# Patient Record
Sex: Female | Born: 1960 | Race: White | Hispanic: No | Marital: Married | Smoking: Former smoker
Health system: Southern US, Community
[De-identification: ages and names within clinical notes are randomized; demographics above are authoritative.]

## PROBLEM LIST (undated history)

## (undated) DIAGNOSIS — K219 Gastro-esophageal reflux disease without esophagitis: Secondary | ICD-10-CM

## (undated) DIAGNOSIS — E039 Hypothyroidism, unspecified: Secondary | ICD-10-CM

## (undated) DIAGNOSIS — I10 Essential (primary) hypertension: Secondary | ICD-10-CM

## (undated) DIAGNOSIS — G43909 Migraine, unspecified, not intractable, without status migrainosus: Secondary | ICD-10-CM

## (undated) DIAGNOSIS — F329 Major depressive disorder, single episode, unspecified: Secondary | ICD-10-CM

## (undated) DIAGNOSIS — J45909 Unspecified asthma, uncomplicated: Secondary | ICD-10-CM

## (undated) DIAGNOSIS — F32A Depression, unspecified: Secondary | ICD-10-CM

## (undated) DIAGNOSIS — D649 Anemia, unspecified: Secondary | ICD-10-CM

## (undated) DIAGNOSIS — E785 Hyperlipidemia, unspecified: Secondary | ICD-10-CM

## (undated) DIAGNOSIS — E559 Vitamin D deficiency, unspecified: Secondary | ICD-10-CM

## (undated) DIAGNOSIS — C73 Malignant neoplasm of thyroid gland: Secondary | ICD-10-CM

## (undated) HISTORY — DX: Essential (primary) hypertension: I10

## (undated) HISTORY — DX: Major depressive disorder, single episode, unspecified: F32.9

## (undated) HISTORY — DX: Unspecified asthma, uncomplicated: J45.909

## (undated) HISTORY — PX: APPENDECTOMY: SHX54

## (undated) HISTORY — DX: Anemia, unspecified: D64.9

## (undated) HISTORY — DX: Migraine, unspecified, not intractable, without status migrainosus: G43.909

## (undated) HISTORY — PX: MANDIBLE SURGERY: SHX707

## (undated) HISTORY — DX: Gastro-esophageal reflux disease without esophagitis: K21.9

## (undated) HISTORY — DX: Hyperlipidemia, unspecified: E78.5

## (undated) HISTORY — PX: POLYPECTOMY: SHX149

## (undated) HISTORY — DX: Vitamin D deficiency, unspecified: E55.9

## (undated) HISTORY — DX: Hypothyroidism, unspecified: E03.9

## (undated) HISTORY — DX: Depression, unspecified: F32.A

---

## 1998-07-13 ENCOUNTER — Ambulatory Visit (HOSPITAL_COMMUNITY): Admission: RE | Admit: 1998-07-13 | Discharge: 1998-07-13 | Payer: Self-pay | Admitting: *Deleted

## 1999-04-19 ENCOUNTER — Other Ambulatory Visit: Admission: RE | Admit: 1999-04-19 | Discharge: 1999-04-19 | Payer: Self-pay | Admitting: *Deleted

## 2000-08-02 ENCOUNTER — Other Ambulatory Visit: Admission: RE | Admit: 2000-08-02 | Discharge: 2000-08-02 | Payer: Self-pay | Admitting: *Deleted

## 2000-08-22 ENCOUNTER — Emergency Department (HOSPITAL_COMMUNITY): Admission: EM | Admit: 2000-08-22 | Discharge: 2000-08-22 | Payer: Self-pay | Admitting: Emergency Medicine

## 2002-04-14 ENCOUNTER — Ambulatory Visit (HOSPITAL_COMMUNITY): Admission: RE | Admit: 2002-04-14 | Discharge: 2002-04-14 | Payer: Self-pay | Admitting: Internal Medicine

## 2002-04-14 ENCOUNTER — Encounter: Payer: Self-pay | Admitting: Internal Medicine

## 2002-08-18 ENCOUNTER — Encounter: Payer: Self-pay | Admitting: Internal Medicine

## 2002-08-18 ENCOUNTER — Ambulatory Visit (HOSPITAL_COMMUNITY): Admission: RE | Admit: 2002-08-18 | Discharge: 2002-08-18 | Payer: Self-pay | Admitting: Internal Medicine

## 2003-02-02 ENCOUNTER — Ambulatory Visit (HOSPITAL_COMMUNITY): Admission: RE | Admit: 2003-02-02 | Discharge: 2003-02-02 | Payer: Self-pay | Admitting: Internal Medicine

## 2003-02-02 ENCOUNTER — Encounter: Payer: Self-pay | Admitting: Internal Medicine

## 2003-02-02 ENCOUNTER — Other Ambulatory Visit: Admission: RE | Admit: 2003-02-02 | Discharge: 2003-02-02 | Payer: Self-pay | Admitting: Internal Medicine

## 2003-06-24 ENCOUNTER — Ambulatory Visit (HOSPITAL_COMMUNITY): Admission: RE | Admit: 2003-06-24 | Discharge: 2003-06-24 | Payer: Self-pay | Admitting: Internal Medicine

## 2004-02-03 ENCOUNTER — Ambulatory Visit (HOSPITAL_COMMUNITY): Admission: RE | Admit: 2004-02-03 | Discharge: 2004-02-03 | Payer: Self-pay | Admitting: Internal Medicine

## 2005-01-24 ENCOUNTER — Other Ambulatory Visit: Admission: RE | Admit: 2005-01-24 | Discharge: 2005-01-24 | Payer: Self-pay | Admitting: Internal Medicine

## 2007-02-22 ENCOUNTER — Other Ambulatory Visit: Admission: RE | Admit: 2007-02-22 | Discharge: 2007-02-22 | Payer: Self-pay | Admitting: Internal Medicine

## 2007-03-07 ENCOUNTER — Ambulatory Visit (HOSPITAL_COMMUNITY): Admission: RE | Admit: 2007-03-07 | Discharge: 2007-03-07 | Payer: Self-pay | Admitting: Internal Medicine

## 2007-05-14 ENCOUNTER — Ambulatory Visit: Payer: Self-pay

## 2007-11-06 ENCOUNTER — Ambulatory Visit (HOSPITAL_COMMUNITY): Admission: RE | Admit: 2007-11-06 | Discharge: 2007-11-06 | Payer: Self-pay | Admitting: Internal Medicine

## 2007-11-19 ENCOUNTER — Other Ambulatory Visit: Admission: RE | Admit: 2007-11-19 | Discharge: 2007-11-19 | Payer: Self-pay | Admitting: Otolaryngology

## 2008-07-06 ENCOUNTER — Other Ambulatory Visit: Admission: RE | Admit: 2008-07-06 | Discharge: 2008-07-06 | Payer: Self-pay | Admitting: Internal Medicine

## 2008-07-20 ENCOUNTER — Ambulatory Visit (HOSPITAL_COMMUNITY): Admission: RE | Admit: 2008-07-20 | Discharge: 2008-07-20 | Payer: Self-pay | Admitting: Internal Medicine

## 2009-08-05 ENCOUNTER — Ambulatory Visit (HOSPITAL_COMMUNITY): Admission: RE | Admit: 2009-08-05 | Discharge: 2009-08-05 | Payer: Self-pay | Admitting: Internal Medicine

## 2010-01-20 ENCOUNTER — Other Ambulatory Visit: Admission: RE | Admit: 2010-01-20 | Discharge: 2010-01-20 | Payer: Self-pay | Admitting: Internal Medicine

## 2010-05-22 ENCOUNTER — Encounter: Payer: Self-pay | Admitting: Internal Medicine

## 2010-06-30 HISTORY — PX: TOTAL THYROIDECTOMY: SHX2547

## 2010-07-08 ENCOUNTER — Other Ambulatory Visit (HOSPITAL_COMMUNITY): Payer: Self-pay | Admitting: Internal Medicine

## 2010-07-08 DIAGNOSIS — Z1231 Encounter for screening mammogram for malignant neoplasm of breast: Secondary | ICD-10-CM

## 2010-07-21 ENCOUNTER — Ambulatory Visit (HOSPITAL_COMMUNITY)
Admission: RE | Admit: 2010-07-21 | Discharge: 2010-07-21 | Disposition: A | Discharge status: Home / Self Care | Payer: 59 | Source: Ambulatory Visit | Attending: Otolaryngology | Admitting: Otolaryngology

## 2010-07-21 ENCOUNTER — Encounter (HOSPITAL_COMMUNITY)
Admission: RE | Admit: 2010-07-21 | Discharge: 2010-07-21 | Disposition: A | Discharge status: Home / Self Care | Payer: 59 | Source: Ambulatory Visit | Attending: Otolaryngology | Admitting: Otolaryngology

## 2010-07-21 ENCOUNTER — Other Ambulatory Visit (HOSPITAL_COMMUNITY): Payer: Self-pay | Admitting: Otolaryngology

## 2010-07-21 DIAGNOSIS — E079 Disorder of thyroid, unspecified: Secondary | ICD-10-CM

## 2010-07-21 DIAGNOSIS — Z01818 Encounter for other preprocedural examination: Secondary | ICD-10-CM | POA: Insufficient documentation

## 2010-07-21 DIAGNOSIS — Z01812 Encounter for preprocedural laboratory examination: Secondary | ICD-10-CM | POA: Insufficient documentation

## 2010-07-21 DIAGNOSIS — Z01810 Encounter for preprocedural cardiovascular examination: Secondary | ICD-10-CM | POA: Insufficient documentation

## 2010-07-21 LAB — CBC
Hemoglobin: 12.3 g/dL (ref 12.0–15.0)
MCH: 28.8 pg (ref 26.0–34.0)
MCHC: 32.8 g/dL (ref 30.0–36.0)
RBC: 4.27 MIL/uL (ref 3.87–5.11)
RDW: 13.3 % (ref 11.5–15.5)

## 2010-07-21 LAB — SURGICAL PCR SCREEN: Staphylococcus aureus: NEGATIVE

## 2010-07-27 ENCOUNTER — Other Ambulatory Visit: Payer: Self-pay | Admitting: Otolaryngology

## 2010-07-27 ENCOUNTER — Observation Stay (HOSPITAL_COMMUNITY)
Admission: RE | Admit: 2010-07-27 | Discharge: 2010-07-28 | Disposition: A | Discharge status: Home / Self Care | Payer: 59 | Source: Ambulatory Visit | Attending: Otolaryngology | Admitting: Otolaryngology

## 2010-07-27 DIAGNOSIS — Z01812 Encounter for preprocedural laboratory examination: Secondary | ICD-10-CM | POA: Insufficient documentation

## 2010-07-27 DIAGNOSIS — J45909 Unspecified asthma, uncomplicated: Secondary | ICD-10-CM | POA: Insufficient documentation

## 2010-07-27 DIAGNOSIS — Z01818 Encounter for other preprocedural examination: Secondary | ICD-10-CM | POA: Insufficient documentation

## 2010-07-27 DIAGNOSIS — C73 Malignant neoplasm of thyroid gland: Principal | ICD-10-CM | POA: Insufficient documentation

## 2010-07-27 DIAGNOSIS — K219 Gastro-esophageal reflux disease without esophagitis: Secondary | ICD-10-CM | POA: Insufficient documentation

## 2010-07-27 DIAGNOSIS — Z01810 Encounter for preprocedural cardiovascular examination: Secondary | ICD-10-CM | POA: Insufficient documentation

## 2010-07-27 LAB — CALCIUM: Calcium: 8.7 mg/dL (ref 8.4–10.5)

## 2010-08-02 NOTE — Op Note (Signed)
Samantha Duran, Samantha Duran                  ACCOUNT NO.:  [id number]  MEDICAL RECORD NO.:  [id number]           PATIENT TYPE:  O  LOCATION:  5155                         FACILITY:  [facility]  PHYSICIAN:  Kerah Hardebeck H. Pollyann Kennedy, MD     DATE OF BIRTH:  [birth year]  DATE OF PROCEDURE:  07/27/2010 DATE OF DISCHARGE:                              OPERATIVE REPORT   REFERRING PHYSICIAN:  Lovenia Kim, DO  PREOPERATIVE DIAGNOSIS:  Left thyroid mass.  POSTOPERATIVE DIAGNOSIS:  Papillary thyroid carcinoma.  PROCEDURE:  Total thyroidectomy.  SURGEON:  Kaide Gage H. Pollyann Kennedy, MD  ASSISTANT:  Gloris Manchester. Lazarus Salines, MD  ANESTHESIA:  General endotracheal anesthesia was used.  COMPLICATIONS:  None.  FINDINGS:  A 2-cm firm mass in the left mid thyroid, frozen section diagnosis follicular variant of papillary carcinoma.  No other significant masses palpable and no anterior compartment adenopathy palpable, also no lateral jugular nodes palpable.  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  Blood loss less than 100 mL.  HISTORY:  This is a 50 year old lady who was found to have a left-sided thyroid mass in the past.  FNA revealed suspicious cytology.  Risks, benefits, alternatives, and complications of the procedure were explained to the patient, seemed to understand, agreed to surgery.  PROCEDURE:  The patient was taken to the operating room, placed on the operating table in supine position.  Following induction of general endotracheal anesthesia, the neck was prepped and draped in standard fashion.  A low anterior neck incision was outlined with marking pen and injected with Xylocaine with epinephrine.  A 15 scalpel was used to incise the skin and subcutaneous tissue.  Electrocautery was used to divide through the platysma layer.  Subplatysmal flaps were developed superiorly to the thyroid notch and inferiorly to the clavicle.  The Gelpi retractors were used throughout the case.  The  midline fascia was divided and strap muscles were deflected laterally.  The left lobe was retracted with a Babcock holder and was pulled medially while the superior pole was dissected.  The dissection was accomplished right along the capsule of the gland exposing the superior vasculature and ligating between clamps and dividing.  A 4-0 silk ties were used throughout the case.  As the superior pole was brought down, the recurrent nerve was identified and preserved.  The middle thyroid vein was ligated between the clamps and divided.  The inferior vasculature was then treated in a similar fashion.  The entire dissection was done right on the thyroid capsule.  The thyroid lobe was brought medially and Berry ligament was divided as well.  The isthmus was divided using electrocautery.  The left lobe was sent for frozen section evaluation. Following the results of that, the right lobe was dissected in a similar fashion.  During the dissection of the right lobe, the recurrent nerve was again identified and was preserved.  Two putative parathyroids were identified very close to each other and were preserved with their blood supply.  The right lobe was sent for pathologic evaluation using routine pathology.  The wound was irrigated with saline.  Hemostasis was completed with 4-0 silk ties as needed and bipolar cautery.  A 7-French round JP drain was left in the wound exited through the right side of the incision, secured in place with a nylon suture.  The midline fascia was reapproximated with 4-0 chromic suture.  The platysma layer was also reapproximated with chromic suture.  The subcutaneous layer was similarly closed and Dermabond was used on the skin.  The patient was then awakened, extubated, and transferred to recovery in stable condition.     Cecylia Brazill H. Pollyann Kennedy, MD     JHR/MEDQ  D:  07/27/2010  T:  07/28/2010  Job:  [id number]  cc:   Lovenia Kim, D.O.  Electronically Signed by  Serena Colonel MD on 08/02/2010 09:16:50 PM

## 2010-08-15 ENCOUNTER — Ambulatory Visit (HOSPITAL_COMMUNITY)
Admission: RE | Admit: 2010-08-15 | Discharge: 2010-08-15 | Disposition: A | Discharge status: Home / Self Care | Payer: 59 | Source: Ambulatory Visit | Attending: Internal Medicine | Admitting: Internal Medicine

## 2010-08-15 DIAGNOSIS — Z1231 Encounter for screening mammogram for malignant neoplasm of breast: Secondary | ICD-10-CM

## 2010-09-20 ENCOUNTER — Other Ambulatory Visit (HOSPITAL_COMMUNITY): Payer: Self-pay | Admitting: Endocrinology

## 2010-09-20 DIAGNOSIS — C73 Malignant neoplasm of thyroid gland: Secondary | ICD-10-CM

## 2010-10-03 ENCOUNTER — Inpatient Hospital Stay (HOSPITAL_COMMUNITY): Admission: RE | Admit: 2010-10-03 | Payer: 59 | Source: Ambulatory Visit

## 2010-10-04 ENCOUNTER — Encounter (HOSPITAL_COMMUNITY)
Admission: RE | Admit: 2010-10-04 | Discharge: 2010-10-04 | Disposition: A | Discharge status: Home / Self Care | Payer: 59 | Source: Ambulatory Visit | Attending: Endocrinology | Admitting: Endocrinology

## 2010-10-04 DIAGNOSIS — C73 Malignant neoplasm of thyroid gland: Secondary | ICD-10-CM | POA: Insufficient documentation

## 2010-10-04 DIAGNOSIS — E0789 Other specified disorders of thyroid: Secondary | ICD-10-CM | POA: Insufficient documentation

## 2010-10-04 HISTORY — DX: Malignant neoplasm of thyroid gland: C73

## 2010-10-06 ENCOUNTER — Encounter (HOSPITAL_COMMUNITY): Payer: Self-pay

## 2010-10-06 LAB — HCG, SERUM, QUALITATIVE: Preg, Serum: NEGATIVE

## 2010-10-06 MED ORDER — SODIUM IODIDE I 131 CAPSULE
97.6000 | Freq: Once | INTRAVENOUS | Status: AC | PRN
  Administered 2010-10-06: 97.6 via ORAL

## 2010-10-12 ENCOUNTER — Encounter (HOSPITAL_COMMUNITY): Payer: 59

## 2010-10-13 ENCOUNTER — Encounter (HOSPITAL_COMMUNITY)
Admission: RE | Admit: 2010-10-13 | Discharge: 2010-10-13 | Disposition: A | Discharge status: Home / Self Care | Payer: 59 | Source: Ambulatory Visit | Attending: Endocrinology | Admitting: Endocrinology

## 2010-10-13 ENCOUNTER — Encounter (HOSPITAL_COMMUNITY): Payer: Self-pay

## 2010-10-13 DIAGNOSIS — C73 Malignant neoplasm of thyroid gland: Secondary | ICD-10-CM | POA: Insufficient documentation

## 2011-01-04 ENCOUNTER — Other Ambulatory Visit: Payer: Self-pay | Admitting: Internal Medicine

## 2011-01-04 ENCOUNTER — Other Ambulatory Visit (HOSPITAL_COMMUNITY)
Admission: RE | Admit: 2011-01-04 | Discharge: 2011-01-04 | Disposition: A | Discharge status: Home / Self Care | Payer: 59 | Source: Ambulatory Visit | Attending: Internal Medicine | Admitting: Internal Medicine

## 2011-01-04 DIAGNOSIS — Z01419 Encounter for gynecological examination (general) (routine) without abnormal findings: Secondary | ICD-10-CM | POA: Insufficient documentation

## 2011-06-30 DIAGNOSIS — C73 Malignant neoplasm of thyroid gland: Secondary | ICD-10-CM

## 2011-06-30 HISTORY — DX: Malignant neoplasm of thyroid gland: C73

## 2012-03-07 ENCOUNTER — Other Ambulatory Visit (HOSPITAL_COMMUNITY): Payer: Self-pay | Admitting: Internal Medicine

## 2012-03-07 DIAGNOSIS — Z1231 Encounter for screening mammogram for malignant neoplasm of breast: Secondary | ICD-10-CM

## 2012-03-27 ENCOUNTER — Ambulatory Visit (HOSPITAL_COMMUNITY)
Admission: RE | Admit: 2012-03-27 | Discharge: 2012-03-27 | Disposition: A | Discharge status: Home / Self Care | Payer: 59 | Source: Ambulatory Visit | Attending: Internal Medicine | Admitting: Internal Medicine

## 2012-03-27 DIAGNOSIS — R922 Inconclusive mammogram: Secondary | ICD-10-CM | POA: Insufficient documentation

## 2012-03-27 DIAGNOSIS — Z1231 Encounter for screening mammogram for malignant neoplasm of breast: Secondary | ICD-10-CM

## 2012-04-02 ENCOUNTER — Other Ambulatory Visit: Payer: Self-pay | Admitting: Internal Medicine

## 2012-04-02 DIAGNOSIS — R928 Other abnormal and inconclusive findings on diagnostic imaging of breast: Secondary | ICD-10-CM

## 2012-04-11 ENCOUNTER — Ambulatory Visit
Admission: RE | Admit: 2012-04-11 | Discharge: 2012-04-11 | Disposition: A | Discharge status: Home / Self Care | Payer: 59 | Source: Ambulatory Visit | Attending: Internal Medicine | Admitting: Internal Medicine

## 2012-04-11 DIAGNOSIS — R928 Other abnormal and inconclusive findings on diagnostic imaging of breast: Secondary | ICD-10-CM

## 2012-11-26 ENCOUNTER — Other Ambulatory Visit: Payer: Self-pay | Admitting: Internal Medicine

## 2012-11-26 DIAGNOSIS — N6310 Unspecified lump in the right breast, unspecified quadrant: Secondary | ICD-10-CM

## 2012-12-23 ENCOUNTER — Other Ambulatory Visit (HOSPITAL_COMMUNITY): Payer: Self-pay | Admitting: Endocrinology

## 2012-12-23 DIAGNOSIS — C73 Malignant neoplasm of thyroid gland: Secondary | ICD-10-CM

## 2012-12-24 ENCOUNTER — Ambulatory Visit
Admission: RE | Admit: 2012-12-24 | Discharge: 2012-12-24 | Disposition: A | Discharge status: Home / Self Care | Payer: 59 | Source: Ambulatory Visit | Attending: Internal Medicine | Admitting: Internal Medicine

## 2012-12-24 DIAGNOSIS — N6310 Unspecified lump in the right breast, unspecified quadrant: Secondary | ICD-10-CM

## 2013-01-20 ENCOUNTER — Encounter (HOSPITAL_COMMUNITY): Payer: 59

## 2013-01-21 ENCOUNTER — Encounter (HOSPITAL_COMMUNITY): Payer: 59

## 2013-01-22 ENCOUNTER — Encounter (HOSPITAL_COMMUNITY): Payer: 59

## 2013-01-24 ENCOUNTER — Encounter (HOSPITAL_COMMUNITY): Payer: 59

## 2013-01-28 ENCOUNTER — Encounter: Payer: Self-pay | Admitting: Gastroenterology

## 2013-02-16 ENCOUNTER — Encounter: Payer: Self-pay | Admitting: Internal Medicine

## 2013-02-25 ENCOUNTER — Ambulatory Visit (INDEPENDENT_AMBULATORY_CARE_PROVIDER_SITE_OTHER): Payer: 59 | Admitting: Podiatry

## 2013-02-25 ENCOUNTER — Encounter: Payer: Self-pay | Admitting: Podiatry

## 2013-02-25 ENCOUNTER — Ambulatory Visit: Payer: Self-pay | Admitting: Podiatry

## 2013-02-25 ENCOUNTER — Ambulatory Visit (INDEPENDENT_AMBULATORY_CARE_PROVIDER_SITE_OTHER): Payer: 59

## 2013-02-25 VITALS — BP 118/72 | HR 73 | Resp 16 | Ht 68.0 in | Wt 210.0 lb

## 2013-02-25 DIAGNOSIS — M722 Plantar fascial fibromatosis: Secondary | ICD-10-CM

## 2013-02-25 MED ORDER — METHYLPREDNISOLONE (PAK) 4 MG PO TABS: ORAL_TABLET | ORAL | Status: DC

## 2013-02-25 MED ORDER — MELOXICAM 15 MG PO TABS: 15.0000 mg | ORAL_TABLET | Freq: Every day | ORAL | Status: DC

## 2013-02-25 NOTE — Patient Instructions (Signed)
Plantar Fasciitis Plantar fasciitis is a common condition that causes foot pain. It is soreness (inflammation) of the band of tough fibrous tissue on the bottom of the foot that runs from the heel bone (calcaneus) to the ball of the foot. The cause of this soreness may be from excessive standing, poor fitting shoes, running on hard surfaces, being overweight, having an abnormal walk, or overuse (this is common in runners) of the painful foot or feet. It is also common in aerobic exercise dancers and ballet dancers. SYMPTOMS  Most people with plantar fasciitis complain of:  Severe pain in the morning on the bottom of their foot especially when taking the first steps out of bed. This pain recedes after a few minutes of walking.  Severe pain is experienced also during walking following a long period of inactivity.  Pain is worse when walking barefoot or up stairs DIAGNOSIS   Your caregiver will diagnose this condition by examining and feeling your foot.  Special tests such as X-rays of your foot, are usually not needed. PREVENTION   Consult a sports medicine professional before beginning a new exercise program.  Walking programs offer a good workout. With walking there is a lower chance of overuse injuries common to runners. There is less impact and less jarring of the joints.  Begin all new exercise programs slowly. If problems or pain develop, decrease the amount of time or distance until you are at a comfortable level.  Wear good shoes and replace them regularly.  Stretch your foot and the heel cords at the back of the ankle (Achilles tendon) both before and after exercise.     Plantar Fasciitis (Heel Spur Syndrome) with Rehab The plantar fascia is a fibrous, ligament-like, soft-tissue structure that spans the bottom of the foot. Plantar fasciitis is a condition that causes pain in the foot due to inflammation of the tissue. SYMPTOMS   Pain and tenderness on the underneath side of  the foot.  Pain that worsens with standing or walking. CAUSES  Plantar fasciitis is caused by irritation and injury to the plantar fascia on the underneath side of the foot. Common mechanisms of injury include:  Direct trauma to bottom of the foot.  Damage to a small nerve that runs under the foot where the main fascia attaches to the heel bone.  Stress placed on the plantar fascia due to bone spurs. RISK INCREASES WITH:   Activities that place stress on the plantar fascia (running, jumping, pivoting, or cutting).  Poor strength and flexibility.  Improperly fitted shoes.  Tight calf muscles.  Flat feet.  Failure to warm-up properly before activity.  Obesity. PREVENTION  Warm up and stretch properly before activity.  Allow for adequate recovery between workouts.  Maintain physical fitness:  Strength, flexibility, and endurance.  Cardiovascular fitness.  Maintain a health body weight.  Avoid stress on the plantar fascia.  Wear properly fitted shoes, including arch supports for individuals who have flat feet. PROGNOSIS  If treated properly, then the symptoms of plantar fasciitis usually resolve without surgery. However, occasionally surgery is necessary. RELATED COMPLICATIONS   Recurrent symptoms that may result in a chronic condition.  Problems of the lower back that are caused by compensating for the injury, such as limping.  Pain or weakness of the foot during push-off following surgery.  Chronic inflammation, scarring, and partial or complete fascia tear, occurring more often from repeated injections. TREATMENT  Treatment initially involves the use of ice and medication to help reduce pain   and inflammation. The use of strengthening and stretching exercises may help reduce pain with activity, especially stretches of the Achilles tendon. These exercises may be performed at home or with a therapist. Your caregiver may recommend that you use heel cups of arch  supports to help reduce stress on the plantar fascia. Occasionally, corticosteroid injections are given to reduce inflammation. If symptoms persist for greater than 6 months despite non-surgical (conservative), then surgery may be recommended.  MEDICATION   If pain medication is necessary, then nonsteroidal anti-inflammatory medications, such as aspirin and ibuprofen, or other minor pain relievers, such as acetaminophen, are often recommended.  Do not take pain medication within 7 days before surgery.  Prescription pain relievers may be given if deemed necessary by your caregiver. Use only as directed and only as much as you need.  Corticosteroid injections may be given by your caregiver. These injections should be reserved for the most serious cases, because they may only be given a certain number of times. HEAT AND COLD  Cold treatment (icing) relieves pain and reduces inflammation. Cold treatment should be applied for 10 to 15 minutes every 2 to 3 hours for inflammation and pain and immediately after any activity that aggravates your symptoms. Use ice packs or massage the area with a piece of ice (ice massage).  Heat treatment may be used prior to performing the stretching and strengthening activities prescribed by your caregiver, physical therapist, or athletic trainer. Use a heat pack or soak the injury in warm water. SEEK IMMEDIATE MEDICAL CARE IF:  Treatment seems to offer no benefit, or the condition worsens.  Any medications produce adverse side effects. EXERCISES RANGE OF MOTION (ROM) AND STRETCHING EXERCISES - Plantar Fasciitis (Heel Spur Syndrome) These exercises may help you when beginning to rehabilitate your injury. Your symptoms may resolve with or without further involvement from your physician, physical therapist or athletic trainer. While completing these exercises, remember:   Restoring tissue flexibility helps normal motion to return to the joints. This allows healthier,  less painful movement and activity.  An effective stretch should be held for at least 30 seconds.  A stretch should never be painful. You should only feel a gentle lengthening or release in the stretched tissue. RANGE OF MOTION - Toe Extension, Flexion  Sit with your right / left leg crossed over your opposite knee.  Grasp your toes and gently pull them back toward the top of your foot. You should feel a stretch on the bottom of your toes and/or foot.  Hold this stretch for __________ seconds.  Now, gently pull your toes toward the bottom of your foot. You should feel a stretch on the top of your toes and or foot.  Hold this stretch for __________ seconds. Repeat __________ times. Complete this stretch __________ times per day.  RANGE OF MOTION - Ankle Dorsiflexion, Active Assisted  Remove shoes and sit on a chair that is preferably not on a carpeted surface.  Place right / left foot under knee. Extend your opposite leg for support.  Keeping your heel down, slide your right / left foot back toward the chair until you feel a stretch at your ankle or calf. If you do not feel a stretch, slide your bottom forward to the edge of the chair, while still keeping your heel down.  Hold this stretch for __________ seconds. Repeat __________ times. Complete this stretch __________ times per day.  STRETCH  Gastroc, Standing  Place hands on wall.  Extend right / left   leg, keeping the front knee somewhat bent.  Slightly point your toes inward on your back foot.  Keeping your right / left heel on the floor and your knee straight, shift your weight toward the wall, not allowing your back to arch.  You should feel a gentle stretch in the right / left calf. Hold this position for __________ seconds. Repeat __________ times. Complete this stretch __________ times per day. STRETCH  Soleus, Standing  Place hands on wall.  Extend right / left leg, keeping the other knee somewhat bent.  Slightly  point your toes inward on your back foot.  Keep your right / left heel on the floor, bend your back knee, and slightly shift your weight over the back leg so that you feel a gentle stretch deep in your back calf.  Hold this position for __________ seconds. Repeat __________ times. Complete this stretch __________ times per day. STRETCH  Gastrocsoleus, Standing  Note: This exercise can place a lot of stress on your foot and ankle. Please complete this exercise only if specifically instructed by your caregiver.   Place the ball of your right / left foot on a step, keeping your other foot firmly on the same step.  Hold on to the wall or a rail for balance.  Slowly lift your other foot, allowing your body weight to press your heel down over the edge of the step.  You should feel a stretch in your right / left calf.  Hold this position for __________ seconds.  Repeat this exercise with a slight bend in your right / left knee. Repeat __________ times. Complete this stretch __________ times per day.  STRENGTHENING EXERCISES - Plantar Fasciitis (Heel Spur Syndrome)  These exercises may help you when beginning to rehabilitate your injury. They may resolve your symptoms with or without further involvement from your physician, physical therapist or athletic trainer. While completing these exercises, remember:   Muscles can gain both the endurance and the strength needed for everyday activities through controlled exercises.  Complete these exercises as instructed by your physician, physical therapist or athletic trainer. Progress the resistance and repetitions only as guided. STRENGTH - Towel Curls  Sit in a chair positioned on a non-carpeted surface.  Place your foot on a towel, keeping your heel on the floor.  Pull the towel toward your heel by only curling your toes. Keep your heel on the floor.  If instructed by your physician, physical therapist or athletic trainer, add  ____________________ at the end of the towel. Repeat __________ times. Complete this exercise __________ times per day. STRENGTH - Ankle Inversion  Secure one end of a rubber exercise band/tubing to a fixed object (table, pole). Loop the other end around your foot just before your toes.  Place your fists between your knees. This will focus your strengthening at your ankle.  Slowly, pull your big toe up and in, making sure the band/tubing is positioned to resist the entire motion.  Hold this position for __________ seconds.  Have your muscles resist the band/tubing as it slowly pulls your foot back to the starting position. Repeat __________ times. Complete this exercises __________ times per day.  Document Released: 04/17/2005 Document Revised: 07/10/2011 Document Reviewed: 07/30/2008 [employer] Patient Information 2014 [employer].   Run or exercise on even surfaces that are not hard. For example, asphalt is better than pavement.  Do not run barefoot on hard surfaces.  If using a treadmill, vary the incline.  Do not continue to workout   if you have foot or joint problems. Seek professional help if they do not improve. HOME CARE INSTRUCTIONS   Avoid activities that cause you pain until you recover.  Use ice or cold packs on the problem or painful areas after working out.  Only take over-the-counter or prescription medicines for pain, discomfort, or fever as directed by your caregiver.  Soft shoe inserts or athletic shoes with air or gel sole cushions may be helpful.  If problems continue or become more severe, consult a sports medicine caregiver or your own health care provider. Cortisone is a potent anti-inflammatory medication that may be injected into the painful area. You can discuss this treatment with your caregiver. MAKE SURE YOU:   Understand these instructions.  Will watch your condition.  Will get help right away if you are not doing well or get worse. Document  Released: 01/10/2001 Document Revised: 07/10/2011 Document Reviewed: 03/11/2008 [employer] Patient Information 2014 [employer].  

## 2013-02-25 NOTE — Progress Notes (Signed)
  Subjective:    Patient ID: Samantha Duran, female    DOB: 02/16/1961, 52 y.o.   MRN: [id number]  HPI Comments: "My left foot starting hurting me after a lot of walking in [place]"  Plantar heel left  N - throb, aches L - plantar left heel D - August 2014 O - gradual C - AM pain, worse A - walking a lot, certain shoes T - muscle rub, good shoes   Foot Pain      Review of Systems  All other systems reviewed and are negative.       Objective:   Physical Exam: I have reviewed her past medical history medications and allergies. Her vital signs are stable she is alert and oriented x3. Vascular evaluation demonstrates strong palpable pulses bilateral. Capillary fill time to digits one through 5 of the bilateral foot is immediate. Neurologic sensorium is intact per Semmes-Weinstein monofilament and deep tendon reflexes are brisk and equal bilaterally. Muscle strength is 5 over 5 dorsiflexors plantar flexors inverters and evertors. All intrinsic musculature is intact. She has pain on palpation medial calcaneal tubercle of her left heel. Radiographic the right which does demonstrate soft tissue increase in density at the plantar fascial calcaneal insertion site of her left heel. Cutaneous evaluation demonstrates well hydrated cutis without erythema, edema, cellulitis, drainage or odor.        Assessment & Plan:  Plantar fasciitis is the assessment.  We discussed the etiology pathology conservative versus surgical therapies. I injected her heel. Plantar fascial strapping night splint and both oral and written home-going instructions were given. A prescription her for Medrol and nonsteroidals anti-inflammatories were sent to the pharmacy. Followup with her in one month.

## 2013-03-04 ENCOUNTER — Telehealth: Payer: Self-pay | Admitting: *Deleted

## 2013-03-04 NOTE — Telephone Encounter (Signed)
Pt asked when can she take the strapping off, and how long to wear the nightsplint?  I informed pt that she could take the strapping off after 5 days and to wear the nightsplint at night until next visit.

## 2013-03-06 ENCOUNTER — Ambulatory Visit: Payer: 59 | Admitting: Physician Assistant

## 2013-03-06 ENCOUNTER — Encounter: Payer: Self-pay | Admitting: Physician Assistant

## 2013-03-06 VITALS — BP 122/70 | HR 76 | Temp 97.9°F | Resp 16 | Ht 66.5 in | Wt 219.0 lb

## 2013-03-06 DIAGNOSIS — D649 Anemia, unspecified: Secondary | ICD-10-CM

## 2013-03-06 DIAGNOSIS — E559 Vitamin D deficiency, unspecified: Secondary | ICD-10-CM

## 2013-03-06 DIAGNOSIS — E039 Hypothyroidism, unspecified: Secondary | ICD-10-CM

## 2013-03-06 DIAGNOSIS — F32A Depression, unspecified: Secondary | ICD-10-CM

## 2013-03-06 DIAGNOSIS — J45909 Unspecified asthma, uncomplicated: Secondary | ICD-10-CM

## 2013-03-06 DIAGNOSIS — Z0000 Encounter for general adult medical examination without abnormal findings: Secondary | ICD-10-CM

## 2013-03-06 DIAGNOSIS — K219 Gastro-esophageal reflux disease without esophagitis: Secondary | ICD-10-CM

## 2013-03-06 DIAGNOSIS — F329 Major depressive disorder, single episode, unspecified: Secondary | ICD-10-CM

## 2013-03-06 DIAGNOSIS — I10 Essential (primary) hypertension: Secondary | ICD-10-CM | POA: Insufficient documentation

## 2013-03-06 DIAGNOSIS — G43909 Migraine, unspecified, not intractable, without status migrainosus: Secondary | ICD-10-CM

## 2013-03-06 DIAGNOSIS — E785 Hyperlipidemia, unspecified: Secondary | ICD-10-CM

## 2013-03-06 HISTORY — DX: Hypothyroidism, unspecified: E03.9

## 2013-03-06 LAB — CBC WITH DIFFERENTIAL/PLATELET
Eosinophils Relative: 2 % (ref 0–5)
HCT: 38.9 % (ref 36.0–46.0)
Hemoglobin: 13.1 g/dL (ref 12.0–15.0)
Lymphocytes Relative: 24 % (ref 12–46)
Lymphs Abs: 2 10*3/uL (ref 0.7–4.0)
MCV: 88 fL (ref 78.0–100.0)
Monocytes Absolute: 0.6 10*3/uL (ref 0.1–1.0)
Neutro Abs: 5.4 10*3/uL (ref 1.7–7.7)
RBC: 4.42 MIL/uL (ref 3.87–5.11)
WBC: 8.3 10*3/uL (ref 4.0–10.5)

## 2013-03-06 LAB — HEMOGLOBIN A1C: Hgb A1c MFr Bld: 5.7 % — ABNORMAL HIGH (ref <5.7)

## 2013-03-06 NOTE — Progress Notes (Signed)
Complete Physical HPI Patient presents for complete physical.   Patient's blood pressure has been controlled at home. Patient denies chest pain, shortness of breath, dizziness. Patient's cholesterol is diet controlled. she has been working on diet and exercise for prediabetes, denies changes in vision, polys, and paresthesias.   Current Medications:    Medication List       This list is accurate as of: 03/06/13  9:28 AM.  Always use your most recent med list.               ADVAIR DISKUS IN  Inhale into the lungs.     CALCIUM + D PO  Take by mouth.     cholecalciferol 1000 UNITS tablet  Commonly known as:  VITAMIN D  Take 1,000 Units by mouth 2 (two) times daily.     esomeprazole 40 MG capsule  Commonly known as:  NEXIUM  Take 40 mg by mouth daily before breakfast.     ferrous sulfate 325 (65 FE) MG tablet  Take 325 mg by mouth daily with breakfast.     fish oil-omega-3 fatty acids 1000 MG capsule  Take 2 g by mouth daily.     levothyroxine 137 MCG tablet  Commonly known as:  SYNTHROID, LEVOTHROID  Take 137 mcg by mouth daily before breakfast.     meloxicam 15 MG tablet  Commonly known as:  MOBIC  Take 1 tablet (15 mg total) by mouth daily.     methylPREDNIsolone 4 MG tablet  Commonly known as:  MEDROL DOSPACK  follow package directions     montelukast 10 MG tablet  Commonly known as:  SINGULAIR  Take 10 mg by mouth at bedtime.       Health Maintainance:  Tetanus: 2008 Pneumovax: 2009 Flu vaccine: 01/2013 Zostavax: N/A Pap: 2012 due 2015 never abnormal pap MGM: 12/24/2012 normal DEXA: N/A  Colonoscopy: Getting it done December 10st with Dr. Russella Dar.  EGD: N/A  Allergies: No Known Allergies Medical History:  Past Medical History  Diagnosis Date  . Hypertension   . Hyperlipidemia   . GERD (gastroesophageal reflux disease)   . Asthma   . Anemia     iron  . 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 Migraine   . Depression   . Vitamin D deficiency     Surgical History:  Past Surgical History  Procedure Laterality Date  . Appendectomy    . Total thyroidectomy  06/2010    secondary to cancer  . Mandible surgery     Family History: History reviewed. No pertinent family history. Social History:  History  Substance Use Topics  . Smoking status: Former Smoker -- 1.00 packs/day for 5 years    Types: Cigarettes    Quit date: 05/20/1987  . Smokeless tobacco: Never Used  . Alcohol Use: No   ROS Constitutional: Denies weight loss/gain, headaches, insomnia, fatigue, night sweats, and change in appetite. Eyes: Denies redness, blurred vision, diplopia, discharge, itchy, watery eyes. Eye exam Feb 2014 ENT: Denies discharge, congestion, post nasal drip, sore throat, earache, hearing loss, dental pain, Tinnitus, Vertigo, Sinus pain, snoring.  Cardio: + edema bilateral legs Denies chest pain, palpitations, irregular heartbeat, dyspnea, diaphoresis, orthopnea, PND, claudication,  Respiratory: denies cough, dyspnea, pleurisy, hoarseness, wheezing. Asthma controlled with Advair Gastrointestinal: Denies dysphagia, heartburn, pain, cramps, nausea, vomiting, bloating, diarrhea, constipation, hematemesis, melena, hematochezia, hemorrhoids Genitourinary: Denies dysuria, frequency, urgency, nocturia, hesitancy, discharge, hematuria, flank pain Breast:Breast lumps, nipple discharge, bleeding.  Musculoskeletal: Denies arthralgia, myalgia, stiffness,  Jt. Swelling, pain, Skin: Denies puritis, rash, hives,  acne, eczema, changing in skin lesion Neuro: Weakness, tremor, incoordination, spasms, paresthesia, pain Psychiatric: Denies confusion, memory loss, sensory loss Endocrine: Denies change in weight, skin, hair change, nocturia, and paresthesia, Diabetic Polys, visual blurring, hyper /hypo glycemic episodes.  Heme/Lymph: Excessive bleeding, bruising, enlarged lymph nodes  Physical Exam: Estimated body mass index is 34.82 kg/(m^2) as calculated from the  following:   Height as of this encounter: 5' 6.5" (1.689 m).   Weight as of this encounter: 219 lb (99.338 kg). Filed Vitals:   03/06/13 0923  BP: 122/70  Pulse: 76  Temp: 97.9 F (36.6 C)  Resp: 16   General Appearance: Well nourished, in no apparent distress. Eyes: PERRLA, EOMs, conjunctiva no swelling or erythema, normal fundi and vessels. Sinuses: No Frontal/maxillary tenderness ENT/Mouth: Ext aud canals clear, normal light reflex with TMs without erythema, bulging.  Good dentition. No erythema, swelling, or exudate on post pharynx. Tonsils not swollen or erythematous. Hearing normal.  Neck: Supple, thyroid normal. No bruits Respiratory: Respiratory effort normal, BS equal bilaterally without rales, rhonci, wheezing or stridor. Cardio: Heart sounds normal, regular rate and rhythm without murmurs, rubs or gallops. Peripheral pulses brisk and equal bilaterally, with 1-2 + edema bilaterally  Chest: symmetric, with normal excursions and percussion. Breasts: Symmetric, without lumps, nipple discharge, retractions, or fibrocystic changes.  Abdomen: Flat, soft, with bowl sounds. Nontender, no guarding, rebound, hernias, masses, or organomegaly. .  Lymphatics: Non tender without lymphadenopathy.  Genitourinary: defer Musculoskeletal: Full ROM all peripheral extremities,5/5 strength, and normal gait. Skin: Warm, dry without rashes. + Varicose veins, and one 2X2 mm dark irreg mole lower left center back near spine. Neuro: Cranial nerves intact, reflexes equal bilaterally. Normal muscle tone, no cerebellar symptoms. Sensation intact.  Pysch: Awake and oriented X 3, normal affect, Insight and Judgment appropriate.   EKG: WNL with no changes.   Assessment and Plan: -Hypothyroid. Continue synthroid and check TSH -HTN- continue DASH diet and monitor at home.  Chol- Continue diet and exercise check Chol. GERD- Cont Nexium, handout given. Asthma- cont Advair Anemia- Check iron Mirgraine -  controlled very few Depression- controlled.  Vitamin D Def- check vitamin D and continue med Obesity- Discussed weight loss and how several of her problems can improve with weight loss. Patient will stop sweet tea and hand out given.  Get Colonoscopy on Dec 10,2014. Varicose veins- elevate and get compression stockings Mole- Left lower center back monitor.    Quentin Mulling 9:28 AM

## 2013-03-06 NOTE — Patient Instructions (Signed)

## 2013-03-07 ENCOUNTER — Telehealth: Payer: Self-pay

## 2013-03-07 LAB — HEPATIC FUNCTION PANEL
AST: 12 U/L (ref 0–37)
Albumin: 4 g/dL (ref 3.5–5.2)
Alkaline Phosphatase: 51 U/L (ref 39–117)
Indirect Bilirubin: 0.3 mg/dL (ref 0.0–0.9)
Total Bilirubin: 0.4 mg/dL (ref 0.3–1.2)
Total Protein: 6.6 g/dL (ref 6.0–8.3)

## 2013-03-07 LAB — URINALYSIS, ROUTINE W REFLEX MICROSCOPIC
Bilirubin Urine: NEGATIVE
Glucose, UA: NEGATIVE mg/dL
Hgb urine dipstick: NEGATIVE
Ketones, ur: NEGATIVE mg/dL
Protein, ur: NEGATIVE mg/dL
Urobilinogen, UA: 0.2 mg/dL (ref 0.0–1.0)

## 2013-03-07 LAB — IRON AND TIBC
%SAT: 27 % (ref 20–55)
TIBC: 324 ug/dL (ref 250–470)
UIBC: 236 ug/dL (ref 125–400)

## 2013-03-07 LAB — LIPID PANEL
HDL: 61 mg/dL (ref >39)
LDL Cholesterol: 88 mg/dL (ref 0–99)
Total CHOL/HDL Ratio: 2.7 Ratio

## 2013-03-07 LAB — BASIC METABOLIC PANEL WITH GFR
BUN: 15 mg/dL (ref 6–23)
CO2: 27 mEq/L (ref 19–32)
Chloride: 101 mEq/L (ref 96–112)
Creat: 0.78 mg/dL (ref 0.50–1.10)
Potassium: 4.3 mEq/L (ref 3.5–5.3)

## 2013-03-07 LAB — MAGNESIUM: Magnesium: 2 mg/dL (ref 1.5–2.5)

## 2013-03-07 LAB — URINALYSIS, MICROSCOPIC ONLY
Bacteria, UA: NONE SEEN
Casts: NONE SEEN

## 2013-03-07 LAB — MICROALBUMIN / CREATININE URINE RATIO
Creatinine, Urine: 23.7 mg/dL
Microalb, Ur: 0.5 mg/dL (ref 0.00–1.89)

## 2013-03-07 LAB — VITAMIN B12: Vitamin B-12: 640 pg/mL (ref 211–911)

## 2013-03-07 NOTE — Telephone Encounter (Signed)
Message copied by Linwood Dibbles on Fri Mar 07, 2013  8:56 AM ------      Message from: Quentin Mulling R      Created: Fri Mar 07, 2013  8:29 AM       All of your labs are normal except your [facility] is in prediabetic range which is between 5.7 and 6.4. This is a warning sign for diabetes. Your A1C is a measure of your sugar over the past 3 months and is not affected by what you have eaten over the past few days. Diabetes increases your chances of stroke and heart attack over 300 % and is the leading cause of blindness and kidney failure in the [place]. Please make sure you decrease bad carbs like white bread, white rice, potatoes, corn, soft drinks, pasta, cereals, refined sugars, sweet tea, dried fruits, and fruit juice. Good carbs are okay to eat in moderation like sweet potatoes, brown rice, whole grain pasta/bread, most fruit (expect dried fruit) and you can eat as many veggies as you want.        ------

## 2013-03-07 NOTE — Telephone Encounter (Signed)
LMOM with results and instructions , ok to leave message

## 2013-03-07 NOTE — Telephone Encounter (Signed)
Message copied by Linwood Dibbles on Fri Mar 07, 2013  9:00 AM ------      Message from: [place] R      Created: Fri Mar 07, 2013  8:29 AM       All of your labs are normal except your [facility] is in prediabetic range which is between 5.7 and 6.4. This is a warning sign for diabetes. Your A1C is a measure of your sugar over the past 3 months and is not affected by what you have eaten over the past few days. Diabetes increases your chances of stroke and heart attack over 300 % and is the leading cause of blindness and kidney failure in the [place]. Please make sure you decrease bad carbs like white bread, white rice, potatoes, corn, soft drinks, pasta, cereals, refined sugars, sweet tea, dried fruits, and fruit juice. Good carbs are okay to eat in moderation like sweet potatoes, brown rice, whole grain pasta/bread, most fruit (expect dried fruit) and you can eat as many veggies as you want.        ------

## 2013-04-01 ENCOUNTER — Ambulatory Visit: Payer: 59 | Admitting: Podiatry

## 2013-04-09 ENCOUNTER — Encounter: Payer: 59 | Admitting: Gastroenterology

## 2013-06-11 ENCOUNTER — Ambulatory Visit: Payer: Self-pay | Admitting: Physician Assistant

## 2013-07-28 ENCOUNTER — Other Ambulatory Visit: Payer: Self-pay | Admitting: Internal Medicine

## 2013-07-29 NOTE — Telephone Encounter (Signed)
LMOM TO CALL & SCHEDULE  

## 2013-09-11 ENCOUNTER — Encounter: Payer: Self-pay | Admitting: Gastroenterology

## 2013-09-11 ENCOUNTER — Telehealth: Payer: Self-pay | Admitting: *Deleted

## 2013-09-11 DIAGNOSIS — Z1211 Encounter for screening for malignant neoplasm of colon: Secondary | ICD-10-CM

## 2013-09-11 NOTE — Telephone Encounter (Signed)
ASKING FOR REFERRAL TO GI FOR COLONOSCOPY = Prefers week June 1st through the 5th if possible   For nurse visit ok 1st am appt if possible.

## 2013-09-16 ENCOUNTER — Ambulatory Visit: Payer: Self-pay | Admitting: Physician Assistant

## 2013-10-28 ENCOUNTER — Ambulatory Visit (AMBULATORY_SURGERY_CENTER): Payer: Self-pay

## 2013-10-28 VITALS — Ht 66.0 in | Wt 190.6 lb

## 2013-10-28 DIAGNOSIS — Z1211 Encounter for screening for malignant neoplasm of colon: Secondary | ICD-10-CM

## 2013-10-28 MED ORDER — MOVIPREP 100 G PO SOLR: 1.0000 | Freq: Once | ORAL | Status: DC

## 2013-10-28 NOTE — Progress Notes (Signed)
No allergies to eggs or soy No home oxygen No past problems with anesthesia No diet/weight loss meds  Has email.  Emmi instructions given for colonoscopy. 

## 2013-10-30 ENCOUNTER — Encounter: Payer: Self-pay | Admitting: Gastroenterology

## 2013-11-11 ENCOUNTER — Encounter: Payer: 59 | Admitting: Gastroenterology

## 2013-11-28 ENCOUNTER — Encounter: Payer: 59 | Admitting: Gastroenterology

## 2014-01-26 ENCOUNTER — Telehealth: Payer: Self-pay | Admitting: Gastroenterology

## 2014-01-26 NOTE — Telephone Encounter (Signed)
Pt needing coupon for movi prep due to cost. Left message on voice mail that identified pt by first and last name coupon will be available at the front desk on the forth floor and she can pick it up when she can. Left number to me in pre visit 51 to call if she has questions.   Lenard Galloway rn

## 2014-01-29 HISTORY — PX: COLONOSCOPY: SHX174

## 2014-01-30 ENCOUNTER — Encounter: Payer: Self-pay | Admitting: Gastroenterology

## 2014-01-30 ENCOUNTER — Encounter: Payer: 59 | Admitting: Gastroenterology

## 2014-01-30 ENCOUNTER — Ambulatory Visit (AMBULATORY_SURGERY_CENTER): Payer: 59 | Admitting: Gastroenterology

## 2014-01-30 VITALS — BP 127/80 | HR 66 | Temp 97.7°F | Resp 18 | Ht 66.0 in | Wt 190.0 lb

## 2014-01-30 DIAGNOSIS — D126 Benign neoplasm of colon, unspecified: Secondary | ICD-10-CM

## 2014-01-30 DIAGNOSIS — Z1211 Encounter for screening for malignant neoplasm of colon: Secondary | ICD-10-CM

## 2014-01-30 DIAGNOSIS — D123 Benign neoplasm of transverse colon: Secondary | ICD-10-CM

## 2014-01-30 DIAGNOSIS — D122 Benign neoplasm of ascending colon: Secondary | ICD-10-CM

## 2014-01-30 DIAGNOSIS — K635 Polyp of colon: Secondary | ICD-10-CM

## 2014-01-30 MED ORDER — SODIUM CHLORIDE 0.9 % IV SOLN: 500.0000 mL | INTRAVENOUS | Status: DC

## 2014-01-30 NOTE — Progress Notes (Signed)
Called to room to assist during endoscopic procedure.  Patient ID and intended procedure confirmed with present staff. Received instructions for my participation in the procedure from the performing physician.  

## 2014-01-30 NOTE — Op Note (Signed)
[place]  [address] [place], [address]   COLONOSCOPY PROCEDURE REPORT  PATIENT: Samantha Duran, Samantha Duran  MR#: [id number] BIRTHDATE: 1961-01-09 , 53  yrs. old GENDER: female ENDOSCOPIST: Ladene Artist, MD, [facility] REFERRED [id number] Melford Aase, M.D. PROCEDURE DATE:  01/30/2014 PROCEDURE:   Colonoscopy with biopsy and Colonoscopy with snare polypectomy First Screening Colonoscopy - Avg.  risk and is 50 yrs.  old or older Yes.  Prior Negative Screening - Now for repeat screening. N/A  History of Adenoma - Now for follow-up colonoscopy & has been > or = to 3 yrs.  N/A  Polyps Removed Today? Yes. ASA CLASS:   Class II INDICATIONS:average risk for colorectal cancer. MEDICATIONS: Monitored anesthesia care and Propofol 300 mg IV DESCRIPTION OF PROCEDURE:   After the risks benefits and alternatives of the procedure were thoroughly explained, informed consent was obtained.  The digital rectal exam revealed no abnormalities of the rectum.   The LB [id number]  endoscope was introduced through the anus and advanced to the cecum, which was identified by both the appendix and ileocecal valve. No adverse events experienced.   The quality of the prep was excellent, using MoviPrep  The instrument was then slowly withdrawn as the colon was fully examined.  COLON FINDINGS: A sessile polyp measuring 4 mm in size was found in the ascending colon.  A polypectomy was performed with cold forceps.  The resection was complete, the polyp tissue was completely retrieved and sent to histology.   Two sessile polyps measuring 6 mm in size were found in the transverse colon.  A polypectomy was performed with a cold snare.  The resection was complete, the polyp tissue was completely retrieved and sent to histology.   The examination was otherwise normal.  Retroflexed views revealed no abnormalities. The time to cecum=3 minutes 39 seconds.  Withdrawal time=11 minutes 29 seconds.  The scope  was withdrawn and the procedure completed.  COMPLICATIONS: There were no immediate complications.  ENDOSCOPIC IMPRESSION: 1.   Sessile polyp in the ascending colon; polypectomy performed with cold forceps 2.   Two sessile polyps in the transverse colon; polypectomy performed with a cold snare  RECOMMENDATIONS: 1.  Await pathology results 2.  Repeat colonoscopy in 5 years if polyp(s) adenomatous; otherwise 10 years  eSigned:  Ladene Artist, MD, [facility] 01/30/2014 2:10 PM

## 2014-01-30 NOTE — Progress Notes (Signed)
Stable to RR 

## 2014-01-30 NOTE — Patient Instructions (Signed)
YOU HAD AN ENDOSCOPIC PROCEDURE TODAY AT THE [facility]: Refer to the procedure report that was given to you for any specific questions about what was found during the examination.  If the procedure report does not answer your questions, please call your gastroenterologist to clarify.  If you requested that your care partner not be given the details of your procedure findings, then the procedure report has been included in a sealed envelope for you to review at your convenience later.  YOU SHOULD EXPECT: Some feelings of bloating in the abdomen. Passage of more gas than usual.  Walking can help get rid of the air that was put into your GI tract during the procedure and reduce the bloating. If you had a lower endoscopy (such as a colonoscopy or flexible sigmoidoscopy) you may notice spotting of blood in your stool or on the toilet paper. If you underwent a bowel prep for your procedure, then you may not have a normal bowel movement for a few days.  DIET: Your first meal following the procedure should be a light meal and then it is ok to progress to your normal diet.  A half-sandwich or bowl of soup is an example of a good first meal.  Heavy or fried foods are harder to digest and may make you feel nauseous or bloated.  Likewise meals heavy in dairy and vegetables can cause extra gas to form and this can also increase the bloating.  Drink plenty of fluids but you should avoid alcoholic beverages for 24 hours.  ACTIVITY: Your care partner should take you home directly after the procedure.  You should plan to take it easy, moving slowly for the rest of the day.  You can resume normal activity the day after the procedure however you should NOT DRIVE or use heavy machinery for 24 hours (because of the sedation medicines used during the test).    SYMPTOMS TO REPORT IMMEDIATELY: A gastroenterologist can be reached at any hour.  During normal business hours, 8:30 AM to 5:00 PM Monday through Friday,  call [phone].  After hours and on weekends, please call the GI answering service at [phone] who will take a message and have the physician on call contact you.   Following lower endoscopy (colonoscopy or flexible sigmoidoscopy):  Excessive amounts of blood in the stool  Significant tenderness or worsening of abdominal pains  Swelling of the abdomen that is new, acute  Fever of 100F or higher  FOLLOW UP: If any biopsies were taken you will be contacted by phone or by letter within the next 1-3 weeks.  Call your gastroenterologist if you have not heard about the biopsies in 3 weeks.  Our staff will call the home number listed on your records the next business day following your procedure to check on you and address any questions or concerns that you may have at that time regarding the information given to you following your procedure. This is a courtesy call and so if there is no answer at the home number and we have not heard from you through the emergency physician on call, we will assume that you have returned to your regular daily activities without incident.  SIGNATURES/CONFIDENTIALITY: You and/or your care partner have signed paperwork which will be entered into your electronic medical record.  These signatures attest to the fact that that the information above on your After Visit Summary has been reviewed and is understood.  Full responsibility of the confidentiality of this   discharge information lies with you and/or your care-partner.  Recommendations Await pathology results Next colonoscopy determined by pathology results, 5 years or 10 years.

## 2014-02-02 ENCOUNTER — Telehealth: Payer: Self-pay | Admitting: *Deleted

## 2014-02-02 NOTE — Telephone Encounter (Signed)
  Follow up Call-  Call back number 01/30/2014  Post procedure Call Back phone  # [phone]  Permission to leave phone message Yes     Patient questions:  Do you have a fever, pain , or abdominal swelling? No. Pain Score  0 *  Have you tolerated food without any problems? Yes.    Have you been able to return to your normal activities? Yes.    Do you have any questions about your discharge instructions: Diet   No. Medications  No. Follow up visit  No.  Do you have questions or concerns about your Care? No.  Actions: * If pain score is 4 or above: No action needed, pain <4.

## 2014-02-07 ENCOUNTER — Encounter: Payer: Self-pay | Admitting: Gastroenterology

## 2014-02-20 ENCOUNTER — Encounter: Payer: 59 | Admitting: Gastroenterology

## 2014-03-09 ENCOUNTER — Encounter: Payer: Self-pay | Admitting: Physician Assistant

## 2014-03-23 ENCOUNTER — Other Ambulatory Visit (HOSPITAL_COMMUNITY)
Admission: RE | Admit: 2014-03-23 | Discharge: 2014-03-23 | Disposition: A | Discharge status: Home / Self Care | Payer: 59 | Source: Ambulatory Visit | Attending: Internal Medicine | Admitting: Internal Medicine

## 2014-03-23 ENCOUNTER — Ambulatory Visit (INDEPENDENT_AMBULATORY_CARE_PROVIDER_SITE_OTHER): Payer: 59 | Admitting: Emergency Medicine

## 2014-03-23 ENCOUNTER — Encounter: Payer: Self-pay | Admitting: Emergency Medicine

## 2014-03-23 VITALS — BP 122/70 | HR 64 | Temp 98.6°F | Resp 18 | Ht 66.5 in | Wt 195.0 lb

## 2014-03-23 DIAGNOSIS — R6889 Other general symptoms and signs: Secondary | ICD-10-CM

## 2014-03-23 DIAGNOSIS — Z0001 Encounter for general adult medical examination with abnormal findings: Secondary | ICD-10-CM

## 2014-03-23 DIAGNOSIS — N889 Noninflammatory disorder of cervix uteri, unspecified: Secondary | ICD-10-CM

## 2014-03-23 DIAGNOSIS — I493 Ventricular premature depolarization: Secondary | ICD-10-CM

## 2014-03-23 DIAGNOSIS — Z0000 Encounter for general adult medical examination without abnormal findings: Secondary | ICD-10-CM

## 2014-03-23 DIAGNOSIS — Z111 Encounter for screening for respiratory tuberculosis: Secondary | ICD-10-CM

## 2014-03-23 DIAGNOSIS — Z01419 Encounter for gynecological examination (general) (routine) without abnormal findings: Secondary | ICD-10-CM | POA: Diagnosis not present

## 2014-03-23 DIAGNOSIS — R239 Unspecified skin changes: Secondary | ICD-10-CM

## 2014-03-23 DIAGNOSIS — Z124 Encounter for screening for malignant neoplasm of cervix: Secondary | ICD-10-CM

## 2014-03-23 LAB — CBC WITH DIFFERENTIAL/PLATELET
Basophils Absolute: 0.1 10*3/uL (ref 0.0–0.1)
Basophils Relative: 1 % (ref 0–1)
EOS PCT: 3 % (ref 0–5)
Eosinophils Absolute: 0.2 10*3/uL (ref 0.0–0.7)
HEMATOCRIT: 34.4 % — AB (ref 36.0–46.0)
Hemoglobin: 11.3 g/dL — ABNORMAL LOW (ref 12.0–15.0)
LYMPHS ABS: 1.7 10*3/uL (ref 0.7–4.0)
LYMPHS PCT: 28 % (ref 12–46)
MCH: 28 pg (ref 26.0–34.0)
MCHC: 32.8 g/dL (ref 30.0–36.0)
MCV: 85.1 fL (ref 78.0–100.0)
MPV: 9.7 fL (ref 9.4–12.4)
Monocytes Absolute: 0.5 10*3/uL (ref 0.1–1.0)
Monocytes Relative: 8 % (ref 3–12)
Neutro Abs: 3.7 10*3/uL (ref 1.7–7.7)
Neutrophils Relative %: 60 % (ref 43–77)
PLATELETS: 357 10*3/uL (ref 150–400)
RBC: 4.04 MIL/uL (ref 3.87–5.11)
RDW: 14.3 % (ref 11.5–15.5)
WBC: 6.2 10*3/uL (ref 4.0–10.5)

## 2014-03-23 LAB — HEMOGLOBIN A1C
Hgb A1c MFr Bld: 5.7 % — ABNORMAL HIGH (ref <5.7)
MEAN PLASMA GLUCOSE: 117 mg/dL — AB (ref <117)

## 2014-03-23 NOTE — Progress Notes (Signed)
Subjective:    Patient ID: Samantha Duran, female    DOB: 1960/09/27, 53 y.o.   MRN: [id number]  HPI Comments: 53 yo WF CPE and presents for 3 month F/U for HTN, Cholesterol, Pre-Dm, D. deficient controlled with diet/ exercise. She has noticed occasional jump with heart beat sometimes with exertion/ rest. She did have extra caffeine this morning. She is exercising routinely and continuing healthier diet with weight loss.   She denies recent asthma flare. She uses maintenance medicines AD. She notes weight loss also helped asthma.   She is overdue for DERM f/u but denies any skin changes.    Medication List       This list is accurate as of: 03/23/14 11:59 PM.  Always use your most recent med list.               ADVAIR DISKUS IN  Inhale into the lungs.     b complex vitamins tablet  Take 1 tablet by mouth daily.     CALCIUM + D PO  Take by mouth.     cholecalciferol 1000 UNITS tablet  Commonly known as:  VITAMIN D  Take 1,000 Units by mouth 2 (two) times daily.     CO Q 10 PO  Take 200 mg by mouth.     esomeprazole 40 MG capsule  Commonly known as:  NEXIUM  Take 40 mg by mouth daily before breakfast.     ferrous sulfate 325 (65 FE) MG tablet  Take 325 mg by mouth daily with breakfast.     fish oil-omega-3 fatty acids 1000 MG capsule  Take 2 g by mouth daily.     levothyroxine 137 MCG tablet  Commonly known as:  SYNTHROID, LEVOTHROID  Take 137 mcg by mouth daily before breakfast.     multivitamin tablet  Take 1 tablet by mouth daily.     OSTEO ADVANCE PO  Take by mouth.     OSTEO BI-FLEX REGULAR STRENGTH PO  Take by mouth daily.     OVER THE COUNTER MEDICATION  octivite       No Known Allergies   Past Medical History  Diagnosis Date  . Hypertension   . Hyperlipidemia   . GERD (gastroesophageal reflux disease)   . Asthma   . Anemia     iron  . 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 Migraine   . Depression   . Vitamin D deficiency   .  Unspecified hypothyroidism 03/06/2013    Secondary to thyroid cancer and RAI treatment   Past Surgical History  Procedure Laterality Date  . Appendectomy    . Total thyroidectomy  06/2010    secondary to cancer  . Mandible surgery     History  Substance Use Topics  . Smoking status: Former Smoker -- 1.00 packs/day for 5 years    Types: Cigarettes    Quit date: 05/20/1987  . Smokeless tobacco: Never Used  . Alcohol Use: No   Family History  Problem Relation Age of Onset  . Colon cancer Neg Hx   . Pancreatic cancer Neg Hx   . Rectal cancer Neg Hx   . Stomach cancer Neg Hx   . Cancer Maternal Aunt   . Diabetes Paternal Grandmother   . Stroke Paternal Grandfather     MAINTENANCE: Colonoscopy:01/30/14 polyp due 2017 Mammo:12/24/12 BMD:n/a Pap/ Pelvic:2012 WNL EYE:06/2013 Dentist: q 6 month  IMMUNIZATIONS: Tdap:2008 Pneumovax:2009 Zostavax:n/a Influenza:2015 at work  Patient Care Team: Unk Pinto,  MD as PCP - General (Internal Medicine) Webb Laws, OD as Referring Physician (Optometry) Jacelyn Pi, MD as Consulting Physician (Endocrinology) Lever, (Dentist)   Review of Systems  Respiratory: Negative for shortness of breath.   Cardiovascular: Positive for palpitations. Negative for chest pain.  All other systems reviewed and are negative.  BP 122/70 mmHg  Pulse 64  Temp(Src) 98.6 F (37 C) (Temporal)  Resp 18  Ht 5' 6.5" (1.689 m)  Wt 195 lb (88.451 kg)  BMI 31.01 kg/m2  LMP 03/15/2014     Objective:   Physical Exam  Constitutional: She is oriented to person, place, and time. She appears well-developed and well-nourished. No distress.  HENT:  Head: Normocephalic and atraumatic.  Right Ear: External ear normal.  Left Ear: External ear normal.  Nose: Nose normal.  Mouth/Throat: Oropharynx is clear and moist.  Eyes: Conjunctivae and EOM are normal. Pupils are equal, round, and reactive to light. Right eye exhibits no discharge. Left eye  exhibits no discharge. No scleral icterus.  Neck: Normal range of motion. Neck supple. No JVD present. No tracheal deviation present. No thyromegaly present.  Cardiovascular: Normal rate, regular rhythm, normal heart sounds and intact distal pulses.   Right LE with numerous broken veins soft / NT with 1 + Edema- stable per patient  Pulmonary/Chest: Effort normal and breath sounds normal.  Abdominal: Soft. Bowel sounds are normal. She exhibits no distension and no mass. There is no tenderness. There is no rebound and no guarding.  Genitourinary:  GYN-Tilted cervix with questionable firmness  Breasts-WNL  Musculoskeletal: Normal range of motion. She exhibits no edema or tenderness.  Lymphadenopathy:    She has no cervical adenopathy.  Neurological: She is alert and oriented to person, place, and time. She has normal reflexes. No cranial nerve deficit. She exhibits normal muscle tone. Coordination normal.  Skin: Skin is warm and dry. No rash noted. No erythema. No pallor.     Psychiatric: She has a normal mood and affect. Her behavior is normal. Judgment and thought content normal.  Nursing note and vitals reviewed.     AORTA SCAN WNL EKG NSCSPT except 1 PVC     Assessment & Plan:  1. CPE- Update screening labs/ History/ Immunizations/ Testing as needed. Advised healthy diet, QD exercise, increase H20 and continue RX/ Vitamins AD.  2. PVC- Monitor closely may need refer Cardio  3. Abnormal cervix- Ref GYN for further evaluation  4.  Irreg Nevi- monitor for any change, call for removal appointment with [facility]

## 2014-03-23 NOTE — Patient Instructions (Signed)
Premature Beats A premature beat is an extra heartbeat that happens earlier than normal. Premature beats are called premature atrial contractions (PACs) or premature ventricular contractions (PVCs) depending on the area of the heart where they start. CAUSES  Premature beats may be brought on by a variety of factors including:  Emotional stress.  Lack of sleep.  Caffeine.  Asthma medicines.  Stimulants.  Herbal teas.  Dietary supplements.  Alcohol. In most cases, premature beats are not dangerous and are not a sign of serious heart disease. Most patients evaluated for premature beats have completely normal heart function. Rarely, premature beats may be a sign of more significant heart problems or medical illness. SYMPTOMS  Premature beats may cause palpitations. This means you feel like your heart is skipping a beat or beating harder than usual. Sometimes, slight chest pain occurs with premature beats, lasting only a few seconds. This pain has been described as a "flopping" feeling inside the chest. In many cases, premature beats do not cause any symptoms and they are only detected when an electrocardiography test (EKG) or heart monitoring is performed. DIAGNOSIS  Your caregiver may run some tests to evaluate your heart such as an EKG or echocardiography. You may need to wear a portable heart monitor for several days to record the electrical activity of your heart. Blood testing may also be performed to check your electrolytes and thyroid function. TREATMENT  Premature beats usually go away with rest. If the problem continues, your caregiver will determine a treatment plan for you.  HOME CARE INSTRUCTIONS  Get plenty of rest over the next few days until your symptoms improve.  Avoid coffee, tea, alcohol, and soda (pop, cola).  Do not smoke. SEEK MEDICAL CARE IF:  Your symptoms continue after 1 to 2 days of rest.  You have new symptoms, such as chest pain or trouble  breathing. SEEK IMMEDIATE MEDICAL CARE IF:  You have severe chest pain or abdominal pain.  You have pain that radiates into the neck, arm, or jaw.  You faint or have extreme weakness.  You have shortness of breath.  Your heartbeat races for more than 5 seconds. MAKE SURE YOU:  Understand these instructions.  Will watch your condition.  Will get help right away if you are not doing well or get worse. Document Released: 05/25/2004 Document Revised: 07/10/2011 Document Reviewed: 12/19/2010 [facility] Patient Information 2015 [place], [place]. This information is not intended to replace advice given to you by your health care provider. Make sure you discuss any questions you have with your health care provider.   Tuberculin Skin Test The PPD skin test is a method used to help with the diagnosis of a disease called tuberculosis (TB). HOW THE TEST IS DONE  The test site (usually the forearm) is cleansed. The PPD extract is then injected under the top layer of skin, causing a blister to form on the skin. The reaction will take 48 - 72 hours to develop. You must return to your health care provider within that time to have the area checked. This will determine whether you have had a significant reaction to the PPD test. A reaction is measured in millimeters of hard swelling (induration) at the site. PREPARATION FOR TEST  There is no special preparation for this test. People with a skin rash or other skin irritations on their arms may need to have the test performed at a different spot on the body. Tell your health care provider if you have ever had a  positive PPD skin test. If so, you should not have a repeat PPD test. Tell your doctor if you have a medical condition or if you take certain drugs, such as steroids, that can affect your immune system. These situations may lead to inaccurate test results. NORMAL FINDINGS A negative reaction (no induration) or a level of hard swelling that falls  below a certain cutoff may mean that a person has not been infected with the bacteria that cause TB. There are different cutoffs for children, people with HIV, and other risk groups. Unfortunately, this is not a perfect test, and up to 20% of people infected with tuberculosis may not have a reaction on the PPD skin test. In addition, certain conditions that affect the immune system (cancer, recent chemotherapy, late-stage AIDS) may cause a false-negative test result.  The reaction will take 48 - 72 hours to develop. You must return to your health care provider within that time to have the area checked. Follow your caregiver's instructions as to where and when to report for this to be done. Ranges for normal findings may vary among different laboratories and hospitals. You should always check with your doctor after having lab work or other tests done to discuss the meaning of your test results and whether your values are considered within normal limits. WHAT ABNORMAL RESULTS MEAN  The results of the test depend on the size of the skin reaction and on the person being tested.  A small reaction (5 mm of hard swelling at the site) is considered to be positive in people who have HIV, who are taking steroid therapy, or who have been in close contact with a person who has active tuberculosis. Larger reactions (greater than or equal to 10 mm) are considered positive in people with diabetes or kidney failure, and in health care workers, among others. In people with no known risks for tuberculosis, a positive reaction requires 15 mm or more of hard swelling at the site. RISKS AND COMPLICATIONS There is a very small risk of severe redness and swelling of the arm in people who have had a previous positive PPD test and who have the test again. There also have been a few rare cases of this reaction in people who have not been tested before. CONSIDERATIONS  A positive skin test does not necessarily mean that a person  has active tuberculosis. More tests will be done to check whether active disease is present. Many people who were born outside the [place] may have had a vaccine called "BCG," which can lead to a false-positive test result. MEANING OF TEST  Your caregiver will go over the test results with you and discuss the importance and meaning of your results, as well as treatment options and the need for additional tests if necessary. OBTAINING THE TEST RESULTS It is your responsibility to obtain your test results. Ask the lab or department performing the test when and how you will get your results. Document Released: 01/25/2005 Document Revised: 07/10/2011 Document Reviewed: 07/25/2013 [facility] Patient Information 2015 [place], [place]. This information is not intended to replace advice given to you by your health care provider. Make sure you discuss any questions you have with your health care provider.

## 2014-03-24 LAB — MICROALBUMIN / CREATININE URINE RATIO
Creatinine, Urine: 50.6 mg/dL
MICROALB/CREAT RATIO: 4 mg/g (ref 0.0–30.0)
Microalb, Ur: 0.2 mg/dL (ref <2.0)

## 2014-03-24 LAB — BASIC METABOLIC PANEL WITH GFR
BUN: 9 mg/dL (ref 6–23)
CHLORIDE: 103 meq/L (ref 96–112)
CO2: 24 mEq/L (ref 19–32)
Calcium: 9.2 mg/dL (ref 8.4–10.5)
Creat: 0.61 mg/dL (ref 0.50–1.10)
GFR, Est African American: >89 mL/min
GLUCOSE: 86 mg/dL (ref 70–99)
POTASSIUM: 4.1 meq/L (ref 3.5–5.3)
Sodium: 138 mEq/L (ref 135–145)

## 2014-03-24 LAB — IRON AND TIBC
%SAT: 7 % — AB (ref 20–55)
IRON: 25 ug/dL — AB (ref 42–145)
TIBC: 373 ug/dL (ref 250–470)
UIBC: 348 ug/dL (ref 125–400)

## 2014-03-24 LAB — HEPATIC FUNCTION PANEL
ALBUMIN: 4.4 g/dL (ref 3.5–5.2)
ALT: 15 U/L (ref 0–35)
AST: 16 U/L (ref 0–37)
Alkaline Phosphatase: 59 U/L (ref 39–117)
Bilirubin, Direct: 0.1 mg/dL (ref 0.0–0.3)
Indirect Bilirubin: 0.2 mg/dL (ref 0.2–1.2)
Total Bilirubin: 0.3 mg/dL (ref 0.2–1.2)
Total Protein: 6.9 g/dL (ref 6.0–8.3)

## 2014-03-24 LAB — LIPID PANEL
Cholesterol: 204 mg/dL — ABNORMAL HIGH (ref 0–200)
HDL: 76 mg/dL (ref >39)
LDL Cholesterol: 104 mg/dL — ABNORMAL HIGH (ref 0–99)
Total CHOL/HDL Ratio: 2.7 Ratio
Triglycerides: 122 mg/dL (ref <150)
VLDL: 24 mg/dL (ref 0–40)

## 2014-03-24 LAB — URINALYSIS, ROUTINE W REFLEX MICROSCOPIC
BILIRUBIN URINE: NEGATIVE
Glucose, UA: NEGATIVE mg/dL
Hgb urine dipstick: NEGATIVE
KETONES UR: NEGATIVE mg/dL
Leukocytes, UA: NEGATIVE
Nitrite: NEGATIVE
PROTEIN: NEGATIVE mg/dL
Specific Gravity, Urine: 1.01 (ref 1.005–1.030)
Urobilinogen, UA: 0.2 mg/dL (ref 0.0–1.0)
pH: 7.5 (ref 5.0–8.0)

## 2014-03-24 LAB — MAGNESIUM: MAGNESIUM: 2 mg/dL (ref 1.5–2.5)

## 2014-03-24 LAB — VITAMIN D 25 HYDROXY (VIT D DEFICIENCY, FRACTURES): Vit D, 25-Hydroxy: 33 ng/mL (ref 30–100)

## 2014-03-24 LAB — INSULIN, FASTING: Insulin fasting, serum: 3.3 u[IU]/mL (ref 2.0–19.6)

## 2014-03-24 LAB — VITAMIN B12: Vitamin B-12: 611 pg/mL (ref 211–911)

## 2014-03-25 ENCOUNTER — Encounter: Payer: Self-pay | Admitting: Emergency Medicine

## 2014-03-25 LAB — TB SKIN TEST
Induration: 0 mm
TB Skin Test: NEGATIVE

## 2014-03-25 LAB — CYTOLOGY - PAP

## 2014-04-10 ENCOUNTER — Other Ambulatory Visit: Payer: Self-pay | Admitting: Physician Assistant

## 2014-04-10 ENCOUNTER — Other Ambulatory Visit: Payer: 59

## 2014-04-10 ENCOUNTER — Encounter (INDEPENDENT_AMBULATORY_CARE_PROVIDER_SITE_OTHER): Payer: Self-pay

## 2014-04-10 DIAGNOSIS — D509 Iron deficiency anemia, unspecified: Secondary | ICD-10-CM

## 2014-04-10 LAB — CBC WITH DIFFERENTIAL/PLATELET
BASOS ABS: 0.1 10*3/uL (ref 0.0–0.1)
Basophils Relative: 2 % — ABNORMAL HIGH (ref 0–1)
EOS ABS: 0.1 10*3/uL (ref 0.0–0.7)
Eosinophils Relative: 3 % (ref 0–5)
HCT: 35.9 % — ABNORMAL LOW (ref 36.0–46.0)
Hemoglobin: 11.9 g/dL — ABNORMAL LOW (ref 12.0–15.0)
LYMPHS PCT: 29 % (ref 12–46)
Lymphs Abs: 1.4 10*3/uL (ref 0.7–4.0)
MCH: 27.5 pg (ref 26.0–34.0)
MCHC: 33.1 g/dL (ref 30.0–36.0)
MCV: 82.9 fL (ref 78.0–100.0)
MONO ABS: 0.4 10*3/uL (ref 0.1–1.0)
MPV: 9.5 fL (ref 9.4–12.4)
Monocytes Relative: 8 % (ref 3–12)
Neutro Abs: 2.8 10*3/uL (ref 1.7–7.7)
Neutrophils Relative %: 58 % (ref 43–77)
PLATELETS: 358 10*3/uL (ref 150–400)
RBC: 4.33 MIL/uL (ref 3.87–5.11)
RDW: 14 % (ref 11.5–15.5)
WBC: 4.9 10*3/uL (ref 4.0–10.5)

## 2014-04-10 LAB — IRON AND TIBC
%SAT: 12 % — AB (ref 20–55)
Iron: 47 ug/dL (ref 42–145)
TIBC: 385 ug/dL (ref 250–470)
UIBC: 338 ug/dL (ref 125–400)

## 2014-04-10 LAB — FERRITIN: Ferritin: 10 ng/mL (ref 10–291)

## 2014-05-18 ENCOUNTER — Other Ambulatory Visit: Payer: Self-pay | Admitting: Obstetrics & Gynecology

## 2014-06-22 ENCOUNTER — Other Ambulatory Visit: Payer: Self-pay | Admitting: Physician Assistant

## 2014-06-24 ENCOUNTER — Telehealth: Payer: Self-pay | Admitting: Internal Medicine

## 2014-06-24 NOTE — Telephone Encounter (Signed)
Called and left message for patient at all numbers to reschedule 07-03-14 appointment.  Thank you, Katrina Judeth Horn [facility] [phone] Fax [phone]

## 2014-07-03 ENCOUNTER — Ambulatory Visit: Payer: Self-pay | Admitting: Physician Assistant

## 2014-07-09 ENCOUNTER — Encounter (INDEPENDENT_AMBULATORY_CARE_PROVIDER_SITE_OTHER): Payer: Self-pay

## 2014-07-14 ENCOUNTER — Ambulatory Visit (INDEPENDENT_AMBULATORY_CARE_PROVIDER_SITE_OTHER): Payer: 59 | Admitting: Podiatry

## 2014-07-14 ENCOUNTER — Encounter: Payer: Self-pay | Admitting: Podiatry

## 2014-07-14 VITALS — BP 138/80 | HR 85 | Resp 18

## 2014-07-14 DIAGNOSIS — L600 Ingrowing nail: Secondary | ICD-10-CM

## 2014-07-14 MED ORDER — NEOMYCIN-POLYMYXIN-HC 3.5-10000-1 OT SOLN: OTIC | Status: DC

## 2014-07-14 NOTE — Patient Instructions (Signed)

## 2014-07-14 NOTE — Progress Notes (Signed)
   Subjective:    Patient ID: Samantha Duran, female    DOB: March 19, 1961, 54 y.o.   MRN: [id number]  HPI I HAVE AN INGROWN TOENAIL ON MY RIGHT BIG TOE AND I HAVE BEEN GETTING PEDICURES AND WAKES ME UP AT NIGHT AND NO DRAINING AND PAINFUL AT THE CUTICLE    Review of Systems     Objective:   Physical Exam: I have reviewed her past medical history medications allergy surgery social history and review of systems. Pulses are strongly palpable. Neurologic sensorium is intact present joints to monofilament. Deep tendon reflexes are intact bilaterally muscle strength +5 over 5 dorsiflexion plantar flexors and inverters and everters all intrinsic musculature is intact. Orthopedic evaluation demonstrates all joints distal to the ankle range of motion without crepitation. Cutaneous evaluation of a sharp incurvated nail margin along the tibiofibular lower hallux right. There is mild erythema associated with this no purulence no malodor.        Assessment & Plan:  Assessment: Ingrown nail paronychia Says hallux right.  Plan: Chemical matrixectomy was performed today after local anesthetic was achieved. She tolerated his procedure well as the nails borders were removed and 3 applications of phenol were applied. Silvadene cream Chappell pattern dry sterile compressive dressing was applied and both oral and written home-going instructions were given for soaking and a prescription for Cortisporin Otic. I will follow-up with her in 1 week.

## 2014-07-17 ENCOUNTER — Other Ambulatory Visit: Payer: Self-pay | Admitting: Internal Medicine

## 2014-07-21 ENCOUNTER — Encounter: Payer: Self-pay | Admitting: Podiatry

## 2014-07-21 ENCOUNTER — Ambulatory Visit (INDEPENDENT_AMBULATORY_CARE_PROVIDER_SITE_OTHER): Payer: 59 | Admitting: Podiatry

## 2014-07-21 VITALS — BP 136/73 | HR 73 | Resp 16

## 2014-07-21 DIAGNOSIS — L600 Ingrowing nail: Secondary | ICD-10-CM

## 2014-07-21 NOTE — Patient Instructions (Signed)

## 2014-07-21 NOTE — Progress Notes (Signed)
She presents today 1 week status post matrixectomy hallux right. She states that it was a little sore yesterday.  Objective: Vital signs are stable she is alert and oriented 3. Pulses are palpable. Margins appear to be healing in quite nicely to the hallux right.  Assessment: Ingrown nail paronychia abscess status post matrixectomy resolving.  Plan: Discontinue Betadine so with Epsom salts and water soaks coverage of the day and leave open at night continue to soak until completely resolved watch for signs and symptoms of infection notify as immediately if there are any.

## 2014-07-29 ENCOUNTER — Encounter: Payer: Self-pay | Admitting: Emergency Medicine

## 2014-07-29 ENCOUNTER — Ambulatory Visit (INDEPENDENT_AMBULATORY_CARE_PROVIDER_SITE_OTHER): Payer: 59 | Admitting: Emergency Medicine

## 2014-07-29 VITALS — BP 122/70 | HR 82 | Temp 98.6°F | Resp 16 | Ht 66.5 in | Wt 199.0 lb

## 2014-07-29 DIAGNOSIS — E782 Mixed hyperlipidemia: Secondary | ICD-10-CM

## 2014-07-29 DIAGNOSIS — R519 Headache, unspecified: Secondary | ICD-10-CM

## 2014-07-29 DIAGNOSIS — IMO0001 Reserved for inherently not codable concepts without codable children: Secondary | ICD-10-CM

## 2014-07-29 DIAGNOSIS — R739 Hyperglycemia, unspecified: Secondary | ICD-10-CM

## 2014-07-29 DIAGNOSIS — R51 Headache: Secondary | ICD-10-CM

## 2014-07-29 DIAGNOSIS — E559 Vitamin D deficiency, unspecified: Secondary | ICD-10-CM

## 2014-07-29 DIAGNOSIS — R030 Elevated blood-pressure reading, without diagnosis of hypertension: Secondary | ICD-10-CM

## 2014-07-29 LAB — CBC WITH DIFFERENTIAL/PLATELET
BASOS ABS: 0.1 10*3/uL (ref 0.0–0.1)
Basophils Relative: 2 % — ABNORMAL HIGH (ref 0–1)
EOS PCT: 3 % (ref 0–5)
Eosinophils Absolute: 0.2 10*3/uL (ref 0.0–0.7)
HEMATOCRIT: 38 % (ref 36.0–46.0)
HEMOGLOBIN: 12.7 g/dL (ref 12.0–15.0)
Lymphocytes Relative: 27 % (ref 12–46)
Lymphs Abs: 1.5 10*3/uL (ref 0.7–4.0)
MCH: 28.5 pg (ref 26.0–34.0)
MCHC: 33.4 g/dL (ref 30.0–36.0)
MCV: 85.4 fL (ref 78.0–100.0)
MONOS PCT: 7 % (ref 3–12)
MPV: 9.9 fL (ref 8.6–12.4)
Monocytes Absolute: 0.4 10*3/uL (ref 0.1–1.0)
Neutro Abs: 3.3 10*3/uL (ref 1.7–7.7)
Neutrophils Relative %: 61 % (ref 43–77)
Platelets: 372 10*3/uL (ref 150–400)
RBC: 4.45 MIL/uL (ref 3.87–5.11)
RDW: 15.7 % — AB (ref 11.5–15.5)
WBC: 5.4 10*3/uL (ref 4.0–10.5)

## 2014-07-29 LAB — BASIC METABOLIC PANEL WITH GFR
BUN: 14 mg/dL (ref 6–23)
CALCIUM: 9.6 mg/dL (ref 8.4–10.5)
CO2: 24 meq/L (ref 19–32)
Chloride: 106 mEq/L (ref 96–112)
Creat: 0.76 mg/dL (ref 0.50–1.10)
GFR, Est African American: >89 mL/min
GFR, Est Non African American: >89 mL/min
Glucose, Bld: 90 mg/dL (ref 70–99)
Potassium: 4.2 mEq/L (ref 3.5–5.3)
Sodium: 136 mEq/L (ref 135–145)

## 2014-07-29 LAB — HEPATIC FUNCTION PANEL
ALBUMIN: 4.7 g/dL (ref 3.5–5.2)
ALT: 18 U/L (ref 0–35)
AST: 18 U/L (ref 0–37)
Alkaline Phosphatase: 56 U/L (ref 39–117)
Bilirubin, Direct: 0.1 mg/dL (ref 0.0–0.3)
Indirect Bilirubin: 0.2 mg/dL (ref 0.2–1.2)
TOTAL PROTEIN: 7.3 g/dL (ref 6.0–8.3)
Total Bilirubin: 0.3 mg/dL (ref 0.2–1.2)

## 2014-07-29 LAB — MAGNESIUM: Magnesium: 2.2 mg/dL (ref 1.5–2.5)

## 2014-07-29 LAB — HEMOGLOBIN A1C
HEMOGLOBIN A1C: 5.9 % — AB (ref <5.7)
Mean Plasma Glucose: 123 mg/dL — ABNORMAL HIGH (ref <117)

## 2014-07-29 LAB — LIPID PANEL
CHOL/HDL RATIO: 2.3 ratio
CHOLESTEROL: 213 mg/dL — AB (ref 0–200)
HDL: 91 mg/dL (ref >=46)
LDL Cholesterol: 109 mg/dL — ABNORMAL HIGH (ref 0–99)
Triglycerides: 67 mg/dL (ref <150)
VLDL: 13 mg/dL (ref 0–40)

## 2014-07-29 LAB — VITAMIN D 25 HYDROXY (VIT D DEFICIENCY, FRACTURES): VIT D 25 HYDROXY: 35 ng/mL (ref 30–100)

## 2014-07-29 NOTE — Progress Notes (Signed)
Subjective:    Patient ID: Samantha Duran, female    DOB: 01-01-1961, 54 y.o.   MRN: [id number]  HPI Comments: 54 yo WF presents for 3 month F/U for HTN, Cholesterol, Pre-Dm, D. Deficient. She has never been on HTN RX manages with diet/ exercise. She exercises 4-5 times weekly with cardio/ weights. She is eating healthier. She does not check BP routinely. She did not increase vitamin D AD. She added RYRE at last OV.   She has occasional right sided headache. She notes mild increase recently with sharp quick pain x couple minutes. She has noticed occurs when exercising occasionally but no other trigger. Pain resolves without treatment. She denies FHX of aneurysm. Denies HA currently.  Patient notes TSH checked with Dr Chalmers Cater and WNL recently  Lab Results      Component                Value               Date                      WBC                      4.9                 04/10/2014                HGB                      11.9*               04/10/2014                HCT                      35.9*               04/10/2014                PLT                      358                 04/10/2014                GLUCOSE                  86                  03/23/2014                CHOL                     204*                03/23/2014                TRIG                     122                 03/23/2014                HDL                      76  03/23/2014                LDLCALC                  104*                03/23/2014                ALT                      15                  03/23/2014                AST                      16                  03/23/2014                NA                       138                 03/23/2014                K                        4.1                 03/23/2014                CL                       103                 03/23/2014                CREATININE               0.61                03/23/2014                BUN                       9                   03/23/2014                CO2                      24                  03/23/2014                TSH                      0.763               03/06/2013                HGBA1C                   5.7*                03/23/2014  MICROALBUR               0.2                 03/23/2014            D 33     Medication List       This list is accurate as of: 07/29/14 10:02 AM.  Always use your most recent med list.               ADVAIR DISKUS 100-50 MCG/DOSE Aepb  Generic drug:  Fluticasone-Salmeterol  INHALE ONE DOSE BY MOUTH TWICE DAILY     aspirin 81 MG tablet  Take 81 mg by mouth daily.     b complex vitamins tablet  Take 1 tablet by mouth daily.     CALCIUM + D PO  Take by mouth.     cholecalciferol 1000 UNITS tablet  Commonly known as:  VITAMIN D  Take 1,000 Units by mouth 2 (two) times daily.     CO Q 10 PO  Take 200 mg by mouth.     esomeprazole 40 MG capsule  Commonly known as:  NEXIUM  Take 40 mg by mouth daily before breakfast.     ferrous sulfate 325 (65 FE) MG tablet  Take 325 mg by mouth daily with breakfast.     fish oil-omega-3 fatty acids 1000 MG capsule  Take 2 g by mouth daily.     levothyroxine 137 MCG tablet  Commonly known as:  SYNTHROID, LEVOTHROID  Take 137 mcg by mouth daily before breakfast.     multivitamin tablet  Take 1 tablet by mouth daily.     neomycin-polymyxin-hydrocortisone otic solution  Commonly known as:  CORTISPORIN  Apply one to two drops to toe after soaking twice daily.     OSTEO ADVANCE PO  Take by mouth.     OSTEO BI-FLEX REGULAR STRENGTH PO  Take by mouth daily.     OVER THE COUNTER MEDICATION  octivite     Red Yeast Rice Extract 600 MG Tabs  Take 600 mg by mouth.       No Known Allergies   Past Medical History  Diagnosis Date  . Hypertension   . Hyperlipidemia   . GERD (gastroesophageal reflux disease)   . Asthma   . Anemia     iron  . 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 Migraine   . Depression   . Vitamin D deficiency   . Unspecified hypothyroidism 03/06/2013    Secondary to thyroid cancer and RAI treatment     Review of Systems  Constitutional: Negative for fatigue.  Respiratory: Negative for shortness of breath.   Cardiovascular: Negative for chest pain.  Neurological: Positive for headaches.  All other systems reviewed and are negative.  BP 122/70 mmHg  Pulse 82  Temp(Src) 98.6 F (37 C) (Temporal)  Resp 16  Ht 5' 6.5" (1.689 m)  Wt 199 lb (90.266 kg)  BMI 31.64 kg/m2  LMP 07/20/2014     Objective:   Physical Exam  Constitutional: She appears well-developed and well-nourished. No distress.  HENT:  Head: Normocephalic and atraumatic.  Right Ear: External ear normal.  Left Ear: External ear normal.  Nose: Nose normal.  Mouth/Throat: Oropharynx is clear and moist. No oropharyngeal exudate.  Eyes: Conjunctivae and EOM are normal. Pupils are equal, round, and reactive to light.  Neck: Normal range of motion. Neck supple. No JVD present. No thyromegaly  present.  Cardiovascular: Normal rate, regular rhythm, normal heart sounds and intact distal pulses.   Pulmonary/Chest: Effort normal and breath sounds normal.  Abdominal: Soft. Bowel sounds are normal. She exhibits no distension. There is no tenderness. There is no rebound.  Musculoskeletal: Normal range of motion. She exhibits no edema or tenderness.  Lymphadenopathy:    She has no cervical adenopathy.  Neurological: She is alert. No cranial nerve deficit.  Skin: Skin is warm and dry. No rash noted. No erythema.  Psychiatric: She has a normal mood and affect. Judgment normal.  Nursing note and vitals reviewed.       Assessment & Plan:   1.  3 month F/U for HTN controlled with diet/ exercise, Cholesterol, Pre-Dm, D. Deficient. Needs healthy diet, cardio QD and obtain healthy weight. Check Labs, Check BP if >130/80 call office   2. Headache- Advised closely monitor, if  symptoms continue and BP is controlled may need Ct/ MRI of brain to further evaluate. w/c if SX increase or ER.

## 2014-07-29 NOTE — Patient Instructions (Signed)
General Headache Without Cause  A general headache is pain or discomfort felt around the head or neck area. The cause may not be found.   HOME CARE   · Keep all doctor visits.  · Only take medicines as told by your doctor.  · Lie down in a dark, quiet room when you have a headache.  · Keep a journal to find out if certain things bring on headaches. For example, write down:  ¨ What you eat and drink.  ¨ How much sleep you get.  ¨ Any change to your diet or medicines.  · Relax by getting a massage or doing other relaxing activities.  · Put ice or heat packs on the head and neck area as told by your doctor.  · Lessen stress.  · Sit up straight. Do not tighten (tense) your muscles.  · Quit smoking if you smoke.  · Lessen how much alcohol you drink.  · Lessen how much caffeine you drink, or stop drinking caffeine.  · Eat and sleep on a regular schedule.  · Get 7 to 9 hours of sleep, or as told by your doctor.  · Keep lights dim if bright lights bother you or make your headaches worse.  GET HELP RIGHT AWAY IF:   · Your headache becomes really bad.  · You have a fever.  · You have a stiff neck.  · You have trouble seeing.  · Your muscles are weak, or you lose muscle control.  · You lose your balance or have trouble walking.  · You feel like you will pass out (faint), or you pass out.  · You have really bad symptoms that are different than your first symptoms.  · You have problems with the medicines given to you by your doctor.  · Your medicines do not work.  · Your headache feels different than the other headaches.  · You feel sick to your stomach (nauseous) or throw up (vomit).  MAKE SURE YOU:   · Understand these instructions.  · Will watch your condition.  · Will get help right away if you are not doing well or get worse.  Document Released: 01/25/2008 Document Revised: 07/10/2011 Document Reviewed: 04/07/2011  ExitCare® Patient Information ©2015 [employer]. This information is not intended to replace advice given to  you by your health care provider. Make sure you discuss any questions you have with your health care provider.

## 2014-07-30 LAB — INSULIN, FASTING: Insulin fasting, serum: 5.2 u[IU]/mL (ref 2.0–19.6)

## 2014-08-04 ENCOUNTER — Encounter (INDEPENDENT_AMBULATORY_CARE_PROVIDER_SITE_OTHER): Payer: Self-pay

## 2014-08-19 DIAGNOSIS — L600 Ingrowing nail: Secondary | ICD-10-CM

## 2014-10-17 ENCOUNTER — Other Ambulatory Visit: Payer: Self-pay | Admitting: Internal Medicine

## 2015-04-12 ENCOUNTER — Encounter: Payer: Self-pay | Admitting: Emergency Medicine

## 2015-05-04 ENCOUNTER — Ambulatory Visit (INDEPENDENT_AMBULATORY_CARE_PROVIDER_SITE_OTHER): Payer: 59 | Admitting: Internal Medicine

## 2015-05-04 ENCOUNTER — Encounter: Payer: Self-pay | Admitting: Internal Medicine

## 2015-05-04 VITALS — BP 118/64 | HR 66 | Temp 98.0°F | Resp 16 | Ht 66.5 in | Wt 199.0 lb

## 2015-05-04 DIAGNOSIS — E785 Hyperlipidemia, unspecified: Secondary | ICD-10-CM

## 2015-05-04 DIAGNOSIS — L989 Disorder of the skin and subcutaneous tissue, unspecified: Secondary | ICD-10-CM

## 2015-05-04 DIAGNOSIS — E039 Hypothyroidism, unspecified: Secondary | ICD-10-CM

## 2015-05-04 DIAGNOSIS — N959 Unspecified menopausal and perimenopausal disorder: Secondary | ICD-10-CM

## 2015-05-04 DIAGNOSIS — Z0000 Encounter for general adult medical examination without abnormal findings: Secondary | ICD-10-CM | POA: Diagnosis not present

## 2015-05-04 DIAGNOSIS — I10 Essential (primary) hypertension: Secondary | ICD-10-CM | POA: Diagnosis not present

## 2015-05-04 DIAGNOSIS — R7303 Prediabetes: Secondary | ICD-10-CM

## 2015-05-04 DIAGNOSIS — Z0001 Encounter for general adult medical examination with abnormal findings: Secondary | ICD-10-CM

## 2015-05-04 DIAGNOSIS — E559 Vitamin D deficiency, unspecified: Secondary | ICD-10-CM

## 2015-05-04 DIAGNOSIS — Z79899 Other long term (current) drug therapy: Secondary | ICD-10-CM

## 2015-05-04 DIAGNOSIS — D649 Anemia, unspecified: Secondary | ICD-10-CM

## 2015-05-04 LAB — MAGNESIUM: Magnesium: 2.2 mg/dL (ref 1.5–2.5)

## 2015-05-04 LAB — CBC WITH DIFFERENTIAL/PLATELET
Basophils Absolute: 0.1 10*3/uL (ref 0.0–0.1)
Basophils Relative: 1 % (ref 0–1)
Eosinophils Absolute: 0.2 10*3/uL (ref 0.0–0.7)
Eosinophils Relative: 4 % (ref 0–5)
HEMATOCRIT: 40.8 % (ref 36.0–46.0)
HEMOGLOBIN: 14 g/dL (ref 12.0–15.0)
LYMPHS PCT: 33 % (ref 12–46)
Lymphs Abs: 2 10*3/uL (ref 0.7–4.0)
MCH: 30.7 pg (ref 26.0–34.0)
MCHC: 34.3 g/dL (ref 30.0–36.0)
MCV: 89.5 fL (ref 78.0–100.0)
MONO ABS: 0.4 10*3/uL (ref 0.1–1.0)
MPV: 10.1 fL (ref 8.6–12.4)
Monocytes Relative: 6 % (ref 3–12)
NEUTROS ABS: 3.4 10*3/uL (ref 1.7–7.7)
NEUTROS PCT: 56 % (ref 43–77)
Platelets: 317 10*3/uL (ref 150–400)
RBC: 4.56 MIL/uL (ref 3.87–5.11)
RDW: 13.3 % (ref 11.5–15.5)
WBC: 6.1 10*3/uL (ref 4.0–10.5)

## 2015-05-04 LAB — HEPATIC FUNCTION PANEL
ALBUMIN: 4.8 g/dL (ref 3.6–5.1)
ALK PHOS: 59 U/L (ref 33–130)
ALT: 21 U/L (ref 6–29)
AST: 17 U/L (ref 10–35)
BILIRUBIN INDIRECT: 0.2 mg/dL (ref 0.2–1.2)
Bilirubin, Direct: 0.1 mg/dL (ref <=0.2)
TOTAL PROTEIN: 7.3 g/dL (ref 6.1–8.1)
Total Bilirubin: 0.3 mg/dL (ref 0.2–1.2)

## 2015-05-04 LAB — IRON AND TIBC
%SAT: 32 % (ref 11–50)
IRON: 125 ug/dL (ref 45–160)
TIBC: 388 ug/dL (ref 250–450)
UIBC: 263 ug/dL (ref 125–400)

## 2015-05-04 LAB — LIPID PANEL
Cholesterol: 210 mg/dL — ABNORMAL HIGH (ref 125–200)
HDL: 71 mg/dL (ref >=46)
LDL CALC: 120 mg/dL (ref <130)
Total CHOL/HDL Ratio: 3 Ratio (ref <=5.0)
Triglycerides: 95 mg/dL (ref <150)
VLDL: 19 mg/dL (ref <30)

## 2015-05-04 LAB — BASIC METABOLIC PANEL WITH GFR
BUN: 18 mg/dL (ref 7–25)
CO2: 28 mmol/L (ref 20–31)
Calcium: 9.5 mg/dL (ref 8.6–10.4)
Chloride: 103 mmol/L (ref 98–110)
Creat: 0.75 mg/dL (ref 0.50–1.05)
GFR, Est African American: >89 mL/min (ref >=60)
Glucose, Bld: 89 mg/dL (ref 65–99)
POTASSIUM: 4.1 mmol/L (ref 3.5–5.3)
Sodium: 141 mmol/L (ref 135–146)

## 2015-05-04 NOTE — Progress Notes (Signed)
Patient ID: Samantha Duran, female   DOB: 08-21-1960, 55 y.o.   MRN: [id number]  Complete Physical  Assessment and Plan:   1. Essential hypertension  - Urinalysis, Routine w reflex microscopic (not at [facility]) - Microalbumin / creatinine urine ratio - EKG 12-Lead - TSH - [place], RETROPERITNL ABD,  LTD  2. Hypothyroidism, unspecified hypothyroidism type  - TSH  3. Hyperlipidemia  - Lipid panel  4. Vitamin D deficiency  - VITAMIN D 25 Hydroxy (Vit-D Deficiency, Fractures)  5. Medication management  - CBC with Differential/Platelet - BASIC METABOLIC PANEL WITH GFR - Hepatic function panel - Magnesium  6. Encounter for general adult medical examination with abnormal findings   7. Anemia, unspecified anemia type  - Iron and TIBC - Vitamin B12  8. Prediabetes  - Hemoglobin A1c - Insulin, random  9. Post menopausal problems  - MM Digital Screening; Future  10. Skin lesion  - Ambulatory referral to Dermatology    Discussed med's effects and SE's. Screening labs and tests as requested with regular follow-up as recommended.  HPI  55 y.o. female  presents for a complete physical.  Her blood pressure has been controlled at home, today their BP is BP: 118/64 mmHg.  She does workout. She denies chest pain, shortness of breath, dizziness. She is walking and is trying to lift weights on her bowflex.    She is not on cholesterol medication and denies myalgias. Her cholesterol is not at goal. The cholesterol last visit was:  Lab Results  Component Value Date   CHOL 213* 07/29/2014   HDL 91 07/29/2014   LDLCALC 109* 07/29/2014   TRIG 67 07/29/2014   CHOLHDL 2.3 07/29/2014  .  She has been working on diet and exercise for prediabetes, she is not on bASA, she is not on ACE/ARB and denies foot ulcerations, hyperglycemia, hypoglycemia , increased appetite, nausea, paresthesia of the feet, polydipsia, polyuria, visual disturbances, vomiting and weight loss. Last A1C in the  office was:  Lab Results  Component Value Date   HGBA1C 5.9* 07/29/2014    Patient is on Vitamin D supplement.   Lab Results  Component Value Date   VD25OH 35 07/29/2014     She notes that she has been having some swelling in her legs which she notes that are sometimes painful.  She also notes that she isn't sleeping well.  She reports that she doesn't have a hard time falling asleep it is more so staying asleep.    She has had normal pap smear.   She is due for mammogram.  She has had an abnormal mammogram in 2014.      Current Medications:  Current Outpatient Prescriptions on File Prior to Visit  Medication Sig Dispense Refill  . ADVAIR DISKUS 100-50 MCG/DOSE AEPB INHALE ONE DOSE BY MOUTH TWICE DAILY 60 each 99  . aspirin 81 MG tablet Take 81 mg by mouth daily.    Marland Kitchen b complex vitamins tablet Take 1 tablet by mouth daily.    . Calcium Carbonate-Vitamin D (CALCIUM + D PO) Take by mouth.    . cholecalciferol (VITAMIN D) 1000 UNITS tablet Take 1,000 Units by mouth 2 (two) times daily.    . Coenzyme Q10 (CO Q 10 PO) Take 200 mg by mouth.    . ferrous sulfate 325 (65 FE) MG tablet Take 325 mg by mouth daily with breakfast.    . Glucosamine-Chondroitin (OSTEO BI-FLEX REGULAR STRENGTH PO) Take by mouth daily.    Marland Kitchen levothyroxine (  SYNTHROID, LEVOTHROID) 137 MCG tablet Take 137 mcg by mouth daily before breakfast.    . Multiple Vitamin (MULTIVITAMIN) tablet Take 1 tablet by mouth daily.    . Nutritional Supplements (OSTEO ADVANCE PO) Take by mouth.    Marland Kitchen OVER THE COUNTER MEDICATION octivite    . Red Yeast Rice Extract 600 MG TABS Take 600 mg by mouth.     No current facility-administered medications on file prior to visit.    Health Maintenance:   Immunization History  Administered Date(s) Administered  . Influenza-Unspecified 02/10/2012, 01/29/2014  . PPD Test 03/23/2014  . Pneumococcal-Unspecified 03/05/2008  . Tdap 02/17/2007    Tetanus: 2018 Pneumovax:2009 Flu  vaccine:2015, declined this year Zostavax: NOT indicated Pap: 2015 MGM: 2014  Colonoscopy: 2015 Last Dental Exam:  Dr. Randol Kern 2016 Last Eye Exam:  Dr. Einar Gip 2016  Patient Care Team: Unk Pinto, MD as PCP - General (Internal Medicine) Webb Laws, OD as Referring Physician (Optometry) Jacelyn Pi, MD as Consulting Physician (Endocrinology)  Allergies: No Known Allergies  Medical History:  Past Medical History  Diagnosis Date  . Hypertension   . Hyperlipidemia   . GERD (gastroesophageal reflux disease)   . Asthma   . Anemia     iron  . Thyroid cancer ([place]) 06/2011    papillary  . Migraine   . Depression   . Vitamin D deficiency   . Unspecified hypothyroidism 03/06/2013    Secondary to thyroid cancer and RAI treatment    Surgical History:  Past Surgical History  Procedure Laterality Date  . Appendectomy    . Total thyroidectomy  06/2010    secondary to cancer  . Mandible surgery      Family History:  Family History  Problem Relation Age of Onset  . Colon cancer Neg Hx   . Pancreatic cancer Neg Hx   . Rectal cancer Neg Hx   . Stomach cancer Neg Hx   . Cancer Maternal Aunt   . Diabetes Paternal Grandmother   . Stroke Paternal Grandfather     Social History:  Social History  Substance Use Topics  . Smoking status: Former Smoker -- 1.00 packs/day for 5 years    Types: Cigarettes    Quit date: 05/20/1987  . Smokeless tobacco: Never Used  . Alcohol Use: No    Review of Systems: Review of Systems  Constitutional: Negative for fever, chills and malaise/fatigue.  HENT: Negative for congestion, sore throat and tinnitus.   Eyes: Negative.   Respiratory: Negative for cough, shortness of breath and wheezing.   Cardiovascular: Positive for palpitations. Negative for chest pain and leg swelling.  Gastrointestinal: Negative for heartburn, diarrhea, constipation, blood in stool and melena.  Genitourinary: Negative.   Skin: Negative.    Neurological: Negative for dizziness, sensory change, loss of consciousness and headaches.  Psychiatric/Behavioral: Negative for depression. The patient has insomnia. The patient is not nervous/anxious.     Physical Exam: Estimated body mass index is 31.64 kg/(m^2) as calculated from the following:   Height as of this encounter: 5' 6.5" (1.689 m).   Weight as of this encounter: 199 lb (90.266 kg). BP 118/64 mmHg  Pulse 66  Temp(Src) 98 F (36.7 C) (Temporal)  Resp 16  Ht 5' 6.5" (1.689 m)  Wt 199 lb (90.266 kg)  BMI 31.64 kg/m2  General Appearance: Well nourished well developed, in no apparent distress.  Eyes: PERRLA, EOMs, conjunctiva no swelling or erythema ENT/Mouth: Ear canals normal without obstruction, swelling, erythema, or discharge.  TMs normal  bilaterally with no erythema, bulging, retraction, or loss of landmark.  Oropharynx moist and clear with no exudate, erythema, or swelling.   Neck: Supple, thyroid normal. No bruits.  No cervical adenopathy Respiratory: Respiratory effort normal, Breath sounds clear A&P without wheeze, rhonchi, rales.   Cardio: RRR without murmurs, rubs or gallops. Brisk peripheral pulses without edema.  Chest: symmetric, with normal excursions Breasts: Symmetric, without lumps, nipple discharge, retractions. Right breast with rough brown and keratinous lesion to the right aereola without tenderness.  No palpable mass.   Abdomen: Soft, nontender, no guarding, rebound, hernias, masses, or organomegaly.  Lymphatics: Non tender without lymphadenopathy.  Genitourinary: Deferred Musculoskeletal: Full ROM all peripheral extremities,5/5 strength, and normal gait.  Skin: Warm, dry without rashes,Multiple flat darker brown nevi across the back and shoulders Neuro: Awake and oriented X 3, Cranial nerves intact, reflexes equal bilaterally. Normal muscle tone, no cerebellar symptoms. Sensation intact.  Psych:  normal affect, Insight and Judgment appropriate.    EKG: Frequent PVCs, NSR, no significant changes  AORTA SCAN: WNL   Over 40 minutes of exam, counseling, chart review and critical decision making was performed  Starlyn Skeans 9:27 AM [facility]

## 2015-05-04 NOTE — Patient Instructions (Signed)

## 2015-05-05 ENCOUNTER — Other Ambulatory Visit: Payer: Self-pay | Admitting: Internal Medicine

## 2015-05-05 LAB — MICROALBUMIN / CREATININE URINE RATIO
Creatinine, Urine: 89 mg/dL (ref 20–320)
MICROALB UR: 0.2 mg/dL
MICROALB/CREAT RATIO: 2 ug/mg{creat} (ref <30)

## 2015-05-05 LAB — VITAMIN D 25 HYDROXY (VIT D DEFICIENCY, FRACTURES): VIT D 25 HYDROXY: 55 ng/mL (ref 30–100)

## 2015-05-05 LAB — URINALYSIS, ROUTINE W REFLEX MICROSCOPIC
Bilirubin Urine: NEGATIVE
GLUCOSE, UA: NEGATIVE
HGB URINE DIPSTICK: NEGATIVE
Ketones, ur: NEGATIVE
LEUKOCYTES UA: NEGATIVE
NITRITE: NEGATIVE
PH: 6.5 (ref 5.0–8.0)
Protein, ur: NEGATIVE
SPECIFIC GRAVITY, URINE: 1.015 (ref 1.001–1.035)

## 2015-05-05 LAB — INSULIN, RANDOM: INSULIN: 6.3 u[IU]/mL (ref 2.0–19.6)

## 2015-05-05 LAB — TSH: TSH: 0.225 u[IU]/mL — AB (ref 0.350–4.500)

## 2015-05-05 LAB — HEMOGLOBIN A1C
HEMOGLOBIN A1C: 5.8 % — AB (ref <5.7)
MEAN PLASMA GLUCOSE: 120 mg/dL — AB (ref <117)

## 2015-05-05 LAB — VITAMIN B12: Vitamin B-12: 656 pg/mL (ref 211–911)

## 2015-05-05 MED ORDER — LEVOTHYROXINE SODIUM 125 MCG PO TABS: 125.0000 ug | ORAL_TABLET | Freq: Every day | ORAL | Status: DC

## 2015-05-13 ENCOUNTER — Other Ambulatory Visit (HOSPITAL_COMMUNITY): Payer: Self-pay | Admitting: Endocrinology

## 2015-05-13 DIAGNOSIS — C73 Malignant neoplasm of thyroid gland: Secondary | ICD-10-CM

## 2015-05-14 ENCOUNTER — Other Ambulatory Visit: Payer: Self-pay | Admitting: Internal Medicine

## 2015-05-14 DIAGNOSIS — N6001 Solitary cyst of right breast: Secondary | ICD-10-CM

## 2015-05-24 ENCOUNTER — Encounter (HOSPITAL_COMMUNITY)
Admission: RE | Admit: 2015-05-24 | Discharge: 2015-05-24 | Disposition: A | Discharge status: Home / Self Care | Payer: 59 | Source: Ambulatory Visit | Attending: Endocrinology | Admitting: Endocrinology

## 2015-05-24 DIAGNOSIS — C73 Malignant neoplasm of thyroid gland: Secondary | ICD-10-CM | POA: Insufficient documentation

## 2015-05-24 MED ORDER — THYROTROPIN ALFA 1.1 MG IM SOLR
0.9000 mg | INTRAMUSCULAR | Status: AC
  Administered 2015-05-24: 0.9 mg via INTRAMUSCULAR

## 2015-05-25 ENCOUNTER — Encounter (HOSPITAL_COMMUNITY)
Admission: RE | Admit: 2015-05-25 | Discharge: 2015-05-25 | Disposition: A | Discharge status: Home / Self Care | Payer: 59 | Source: Ambulatory Visit | Attending: Endocrinology | Admitting: Endocrinology

## 2015-05-25 DIAGNOSIS — C73 Malignant neoplasm of thyroid gland: Secondary | ICD-10-CM | POA: Diagnosis not present

## 2015-05-25 MED ORDER — THYROTROPIN ALFA 1.1 MG IM SOLR
0.9000 mg | INTRAMUSCULAR | Status: AC
  Administered 2015-05-25: 0.9 mg via INTRAMUSCULAR

## 2015-05-25 MED ORDER — STERILE WATER FOR INJECTION IJ SOLN
INTRAMUSCULAR | Status: AC
  Filled 2015-05-25: qty 10

## 2015-05-26 ENCOUNTER — Encounter (HOSPITAL_COMMUNITY)
Admission: RE | Admit: 2015-05-26 | Discharge: 2015-05-26 | Disposition: A | Discharge status: Home / Self Care | Payer: 59 | Source: Ambulatory Visit | Attending: Endocrinology | Admitting: Endocrinology

## 2015-05-26 MED ORDER — SODIUM IODIDE I 131 CAPSULE
4.0000 | Freq: Once | INTRAVENOUS | Status: AC | PRN
  Administered 2015-05-26: 4 via ORAL

## 2015-05-28 ENCOUNTER — Encounter (HOSPITAL_COMMUNITY)
Admission: RE | Admit: 2015-05-28 | Discharge: 2015-05-28 | Disposition: A | Discharge status: Home / Self Care | Payer: 59 | Source: Ambulatory Visit | Attending: Endocrinology | Admitting: Endocrinology

## 2015-05-28 DIAGNOSIS — C73 Malignant neoplasm of thyroid gland: Secondary | ICD-10-CM | POA: Diagnosis not present

## 2015-06-02 ENCOUNTER — Other Ambulatory Visit: Payer: Self-pay

## 2015-06-16 ENCOUNTER — Other Ambulatory Visit: Payer: Self-pay | Admitting: Physician Assistant

## 2015-06-30 ENCOUNTER — Other Ambulatory Visit: Payer: Self-pay

## 2015-07-02 ENCOUNTER — Other Ambulatory Visit: Payer: Self-pay

## 2015-07-16 ENCOUNTER — Ambulatory Visit
Admission: RE | Admit: 2015-07-16 | Discharge: 2015-07-16 | Disposition: A | Discharge status: Home / Self Care | Payer: 59 | Source: Ambulatory Visit | Attending: Internal Medicine | Admitting: Internal Medicine

## 2015-07-16 DIAGNOSIS — N6001 Solitary cyst of right breast: Secondary | ICD-10-CM

## 2015-09-19 ENCOUNTER — Encounter: Payer: Self-pay | Admitting: *Deleted

## 2015-10-08 ENCOUNTER — Ambulatory Visit (INDEPENDENT_AMBULATORY_CARE_PROVIDER_SITE_OTHER): Payer: 59 | Admitting: Internal Medicine

## 2015-10-08 ENCOUNTER — Encounter: Payer: Self-pay | Admitting: Internal Medicine

## 2015-10-08 VITALS — BP 112/68 | HR 70 | Temp 98.0°F | Resp 18 | Ht 66.5 in | Wt 206.0 lb

## 2015-10-08 DIAGNOSIS — R002 Palpitations: Secondary | ICD-10-CM | POA: Diagnosis not present

## 2015-10-08 DIAGNOSIS — E890 Postprocedural hypothyroidism: Secondary | ICD-10-CM | POA: Diagnosis not present

## 2015-10-08 LAB — TSH: TSH: 0.09 m[IU]/L — AB

## 2015-10-08 NOTE — Patient Instructions (Signed)
Bisoprolol tablets What is this medicine? BISOPROLOL (bis [place] proe lol) is a beta-blocker. Beta-blockers reduce the workload on the heart and help it to beat more regularly. This medicine is used to treat high blood pressure. This medicine may be used for other purposes; ask your health care provider or pharmacist if you have questions. What should I tell my health care provider before I take this medicine? They need to know if you have any of these conditions: -chest pain (angina) -diabetes -heart or vessel disease like slow heart rate, worsening heart failure, heart block, sick sinus syndrome or Raynaud's disease -kidney disease -liver disease -lung or breathing disease, like asthma or emphysema -pheochromocytoma -thyroid disease -an unusual or allergic reaction to bisoprolol, other beta-blockers, medicines, foods, dyes, or preservatives -pregnant or trying to get pregnant -breast-feeding How should I use this medicine? Take this medicine by mouth with a glass of water. Follow the directions on the prescription label. You can take this medicine with or without food. Take your doses at regular intervals. Do not take your medicine more often than directed. Do not stop taking this medicine suddenly. This could lead to serious heart-related effects. Talk to your pediatrician regarding the use of this medicine in children. Special care may be needed. Overdosage: If you think you have taken too much of this medicine contact a poison control center or emergency room at once. NOTE: This medicine is only for you. Do not share this medicine with others. What if I miss a dose? If you miss a dose, take it as soon as you can. If it is almost time for your next dose, take only that dose. Do not take double or extra doses. What may interact with this medicine? This medicine may interact with the following medications: -certain medicines for blood pressure, heart disease, irregular heart beat -NSAIDs,  medicines for pain and inflammation, like ibuprofen or naproxen -rifampin This list may not describe all possible interactions. Give your health care provider a list of all the medicines, herbs, non-prescription drugs, or dietary supplements you use. Also tell them if you smoke, drink alcohol, or use illegal drugs. Some items may interact with your medicine. What should I watch for while using this medicine? Visit your doctor or health care professional for regular checks on your progress. Check your heart rate and blood pressure regularly while you are taking this medicine. Ask your doctor or health care professional what your heart rate and blood pressure should be, and when you should contact him or her. You may get drowsy or dizzy. Do not drive, use machinery, or do anything that needs mental alertness until you know how this drug affects you. Do not stand or sit up quickly, especially if you are an older patient. This reduces the risk of dizzy or fainting spells. Alcohol can make you more drowsy and dizzy. Avoid alcoholic drinks. This medicine can affect blood sugar levels. If you have diabetes, check with your doctor or health care professional before you change your diet or the dose of your diabetic medicine. Do not treat yourself for coughs, colds, or pain while you are taking this medicine without asking your doctor or health care professional for advice. Some ingredients may increase your blood pressure. What side effects may I notice from receiving this medicine? Side effects that you should report to your doctor or health care professional as soon as possible: -allergic reactions like skin rash, itching or hives, swelling of the face, lips, or tongue -breathing problems -  chest pain -cold, tingling, or numb hands or feet -confusion -irregular, slow heartbeat -muscle aches and pains -sweating -swollen legs or ankles -tremors -vomiting Side effects that usually do not require medical  attention (report to your doctor or health care professional if they continue or are bothersome): -anxiety -change in sex drive or performance -depression -diarrhea -dry or burning eyes -headache -nausea This list may not describe all possible side effects. Call your doctor for medical advice about side effects. You may report side effects to FDA at 1-800-FDA-1088. Where should I keep my medicine? Keep out of the reach of children. Store at room temperature between 20 and 25 degrees C (68 and 77 degrees F). Protect from moisture. Throw away any unused medicine after the expiration date. NOTE: This sheet is a summary. It may not cover all possible information. If you have questions about this medicine, talk to your doctor, pharmacist, or health care provider.    2016, [employer] Standard. (2012-12-20 14:30:04)

## 2015-10-08 NOTE — Progress Notes (Signed)
   Subjective:    Patient ID: Samantha Duran, female    DOB: 1960/05/05, 55 y.o.   MRN: [id number]  Palpitations  Associated symptoms include shortness of breath. Pertinent negatives include no chest pain, coughing, dizziness, fever or weakness.  Patient presents to the office for evaluation of palpitations.  She notes that she gets them about every hour, sometimes they are worse than others.  Sometimes it feels like fluttering or a flip flop.  She does occasionally feel like it takes her breath away.  It does occasionally cause some mild discomfort.  She gets it at rest and also while walking.  She notes that does try to do some deep breathing which helps a little bit.  She drinks a cup of coffee in the mornings and then drinks decaf teas.   She does occasionally have a soda.  Nothing brings on palpitations.  They do not wake her from sleeping.  She has no history of palpitations.  Chart review does show her last thyroid level to be hyperthyroid secondary to medication replacement.      Review of Systems  Constitutional: Negative for fever, chills and fatigue.  Respiratory: Positive for shortness of breath. Negative for cough, chest tightness and wheezing.   Cardiovascular: Positive for palpitations. Negative for chest pain and leg swelling.  Neurological: Negative for dizziness, syncope, weakness and light-headedness.       Objective:   Physical Exam  Constitutional: She is oriented to person, place, and time. She appears well-developed and well-nourished. No distress.  HENT:  Head: Normocephalic.  Mouth/Throat: Oropharynx is clear and moist. No oropharyngeal exudate.  Eyes: Conjunctivae are normal. No scleral icterus.  Neck: Normal range of motion. Neck supple. No JVD present. No thyromegaly present.  Cardiovascular: Normal rate, regular rhythm, normal heart sounds and intact distal pulses.  Exam reveals no gallop and no friction rub.   No murmur heard. Pulmonary/Chest: Effort normal and  breath sounds normal. No respiratory distress. She has no wheezes. She has no rales. She exhibits no tenderness.  Abdominal: Soft. Bowel sounds are normal. She exhibits no distension and no mass. There is no tenderness. There is no rebound and no guarding.  Musculoskeletal: Normal range of motion.  Lymphadenopathy:    She has no cervical adenopathy.  Neurological: She is alert and oriented to person, place, and time.  Skin: Skin is warm and dry. She is not diaphoretic.  Psychiatric: She has a normal mood and affect. Her behavior is normal. Judgment and thought content normal.  Nursing note and vitals reviewed.   Filed Vitals:   10/08/15 0925  BP: 112/68  Pulse: 70  Temp: 98 F (36.7 C)  Resp: 18          Assessment & Plan:    1. Postoperative hypothyroidism -EKG is in NSR with a single PVC present -suspect that given recent TSH that hyperthyroidism could be the culprit for why patient is having worsening palpitations.  We discussed the use of a low dose beta blocker like bisoprolol 2.5 mg to see if this will stop PVCs but patient would like to see TSH prior to deciding whether to start a beta blocker -of note patient is being followed by Dr. Chalmers Cater for thyroid cancer history.  - TSH

## 2015-11-03 ENCOUNTER — Ambulatory Visit: Payer: Self-pay | Admitting: Internal Medicine

## 2015-12-04 ENCOUNTER — Other Ambulatory Visit: Payer: Self-pay | Admitting: Internal Medicine

## 2016-02-09 ENCOUNTER — Ambulatory Visit (INDEPENDENT_AMBULATORY_CARE_PROVIDER_SITE_OTHER): Payer: 59 | Admitting: *Deleted

## 2016-02-09 DIAGNOSIS — Z23 Encounter for immunization: Secondary | ICD-10-CM

## 2016-03-03 ENCOUNTER — Other Ambulatory Visit: Payer: Self-pay | Admitting: Internal Medicine

## 2016-03-29 ENCOUNTER — Other Ambulatory Visit: Payer: Self-pay | Admitting: Internal Medicine

## 2016-03-29 DIAGNOSIS — R921 Mammographic calcification found on diagnostic imaging of breast: Secondary | ICD-10-CM

## 2016-04-21 ENCOUNTER — Ambulatory Visit
Admission: RE | Admit: 2016-04-21 | Discharge: 2016-04-21 | Disposition: A | Discharge status: Home / Self Care | Payer: 59 | Source: Ambulatory Visit | Attending: Internal Medicine | Admitting: Internal Medicine

## 2016-04-21 DIAGNOSIS — R921 Mammographic calcification found on diagnostic imaging of breast: Secondary | ICD-10-CM

## 2016-05-03 ENCOUNTER — Encounter: Payer: Self-pay | Admitting: Physician Assistant

## 2016-05-03 ENCOUNTER — Ambulatory Visit (INDEPENDENT_AMBULATORY_CARE_PROVIDER_SITE_OTHER): Payer: 59 | Admitting: Physician Assistant

## 2016-05-03 VITALS — BP 120/82 | HR 81 | Temp 97.7°F | Resp 16 | Ht 67.5 in | Wt 209.6 lb

## 2016-05-03 DIAGNOSIS — E559 Vitamin D deficiency, unspecified: Secondary | ICD-10-CM

## 2016-05-03 DIAGNOSIS — E039 Hypothyroidism, unspecified: Secondary | ICD-10-CM

## 2016-05-03 DIAGNOSIS — Z79899 Other long term (current) drug therapy: Secondary | ICD-10-CM

## 2016-05-03 DIAGNOSIS — Z131 Encounter for screening for diabetes mellitus: Secondary | ICD-10-CM

## 2016-05-03 DIAGNOSIS — F3342 Major depressive disorder, recurrent, in full remission: Secondary | ICD-10-CM

## 2016-05-03 DIAGNOSIS — Z0001 Encounter for general adult medical examination with abnormal findings: Secondary | ICD-10-CM

## 2016-05-03 DIAGNOSIS — Z0000 Encounter for general adult medical examination without abnormal findings: Secondary | ICD-10-CM

## 2016-05-03 DIAGNOSIS — Z136 Encounter for screening for cardiovascular disorders: Secondary | ICD-10-CM | POA: Diagnosis not present

## 2016-05-03 DIAGNOSIS — Z1159 Encounter for screening for other viral diseases: Secondary | ICD-10-CM

## 2016-05-03 DIAGNOSIS — K2100 Gastro-esophageal reflux disease with esophagitis, without bleeding: Secondary | ICD-10-CM

## 2016-05-03 DIAGNOSIS — Z23 Encounter for immunization: Secondary | ICD-10-CM

## 2016-05-03 DIAGNOSIS — D649 Anemia, unspecified: Secondary | ICD-10-CM

## 2016-05-03 DIAGNOSIS — Z124 Encounter for screening for malignant neoplasm of cervix: Secondary | ICD-10-CM

## 2016-05-03 DIAGNOSIS — I10 Essential (primary) hypertension: Secondary | ICD-10-CM

## 2016-05-03 DIAGNOSIS — G43909 Migraine, unspecified, not intractable, without status migrainosus: Secondary | ICD-10-CM

## 2016-05-03 DIAGNOSIS — J45909 Unspecified asthma, uncomplicated: Secondary | ICD-10-CM

## 2016-05-03 DIAGNOSIS — E785 Hyperlipidemia, unspecified: Secondary | ICD-10-CM

## 2016-05-03 LAB — CBC WITH DIFFERENTIAL/PLATELET
BASOS PCT: 1 %
Basophils Absolute: 67 cells/uL (ref 0–200)
EOS PCT: 3 %
Eosinophils Absolute: 201 cells/uL (ref 15–500)
HCT: 41.8 % (ref 35.0–45.0)
Hemoglobin: 13.8 g/dL (ref 11.7–15.5)
LYMPHS ABS: 1943 {cells}/uL (ref 850–3900)
LYMPHS PCT: 29 %
MCH: 30.3 pg (ref 27.0–33.0)
MCHC: 33 g/dL (ref 32.0–36.0)
MCV: 91.7 fL (ref 80.0–100.0)
MONO ABS: 402 {cells}/uL (ref 200–950)
MPV: 9.7 fL (ref 7.5–12.5)
Monocytes Relative: 6 %
Neutro Abs: 4087 cells/uL (ref 1500–7800)
Neutrophils Relative %: 61 %
Platelets: 325 10*3/uL (ref 140–400)
RBC: 4.56 MIL/uL (ref 3.80–5.10)
RDW: 14.5 % (ref 11.0–15.0)
WBC: 6.7 10*3/uL (ref 3.8–10.8)

## 2016-05-03 LAB — TSH: TSH: 0.53 mIU/L

## 2016-05-03 LAB — LIPID PANEL
CHOL/HDL RATIO: 2.9 ratio (ref <5.0)
Cholesterol: 221 mg/dL — ABNORMAL HIGH (ref <200)
HDL: 77 mg/dL (ref >50)
LDL CALC: 118 mg/dL — AB (ref <100)
Triglycerides: 129 mg/dL (ref <150)
VLDL: 26 mg/dL (ref <30)

## 2016-05-03 LAB — BASIC METABOLIC PANEL WITH GFR
BUN: 17 mg/dL (ref 7–25)
CO2: 27 mmol/L (ref 20–31)
Calcium: 9.7 mg/dL (ref 8.6–10.4)
Chloride: 103 mmol/L (ref 98–110)
Creat: 0.83 mg/dL (ref 0.50–1.05)
GFR, EST NON AFRICAN AMERICAN: 80 mL/min (ref >=60)
GLUCOSE: 93 mg/dL (ref 65–99)
POTASSIUM: 4.3 mmol/L (ref 3.5–5.3)
SODIUM: 139 mmol/L (ref 135–146)

## 2016-05-03 LAB — HEPATIC FUNCTION PANEL
ALK PHOS: 66 U/L (ref 33–130)
ALT: 23 U/L (ref 6–29)
AST: 20 U/L (ref 10–35)
Albumin: 4.7 g/dL (ref 3.6–5.1)
BILIRUBIN DIRECT: 0.1 mg/dL (ref <=0.2)
BILIRUBIN INDIRECT: 0.3 mg/dL (ref 0.2–1.2)
BILIRUBIN TOTAL: 0.4 mg/dL (ref 0.2–1.2)
Total Protein: 7.6 g/dL (ref 6.1–8.1)

## 2016-05-03 LAB — FERRITIN: Ferritin: 29 ng/mL (ref 10–232)

## 2016-05-03 LAB — IRON AND TIBC
%SAT: 39 % (ref 11–50)
IRON: 145 ug/dL (ref 45–160)
TIBC: 376 ug/dL (ref 250–450)
UIBC: 231 ug/dL (ref 125–400)

## 2016-05-03 LAB — HEMOGLOBIN A1C
Hgb A1c MFr Bld: 5.5 % (ref <5.7)
Mean Plasma Glucose: 111 mg/dL

## 2016-05-03 LAB — VITAMIN B12: Vitamin B-12: 605 pg/mL (ref 200–1100)

## 2016-05-03 LAB — HEPATITIS C ANTIBODY: HCV AB: NEGATIVE

## 2016-05-03 NOTE — Progress Notes (Signed)
Complete Physical  Assessment and Plan: Samantha Duran was seen today for annual exam.  Diagnoses and all orders for this visit:  Encounter for general adult medical examination with abnormal findings  Essential hypertension -     CBC with Differential/Platelet -     BASIC METABOLIC PANEL WITH GFR -     Hepatic function panel -     Urinalysis, Routine w reflex microscopic -     Microalbumin / creatinine urine ratio -     EKG 12-Lead  Hypothyroidism, unspecified type -     TSH  Hyperlipidemia, unspecified hyperlipidemia type -     Lipid panel  Vitamin D deficiency -     VITAMIN D 25 Hydroxy (Vit-D Deficiency, Fractures)  Recurrent major depressive disorder, in full remission (HCC)  Anemia, unspecified type -     Iron and TIBC -     Ferritin -     Vitamin B12  Gastroesophageal reflux disease with esophagitis  Uncomplicated asthma, unspecified asthma severity, unspecified whether persistent  Migraine without status migrainosus, not intractable, unspecified migraine type  Pap smear for cervical cancer screening -     Cytology - PAP  Screening for diabetes mellitus -     Hemoglobin A1c  Medication management -     Magnesium  Screening for viral disease -     HIV antibody -     Hepatitis C antibody    Discussed med's effects and SE's. Screening labs and tests as requested with regular follow-up as recommended. Over 40 minutes of exam, counseling, chart review, and complex, high level critical decision making was performed this visit.   HPI  56 y.o. female  presents for a complete physical and follow up for has Hypertension; Hyperlipidemia; GERD (gastroesophageal reflux disease); Asthma; Anemia; Migraine; Depression; Vitamin D deficiency; and Hypothyroidism on her problem list..  Her blood pressure has been controlled at home, today their BP is BP: 120/82 She does workout. She denies chest pain, shortness of breath, dizziness.   She is not on cholesterol medication and  denies myalgias. Her cholesterol is at goal. The cholesterol last visit was:   Lab Results  Component Value Date   CHOL 210 (H) 05/04/2015   HDL 71 05/04/2015   LDLCALC 120 05/04/2015   TRIG 95 05/04/2015   CHOLHDL 3.0 05/04/2015   She has been working on diet and exercise for prediabetes, she is not on bASA, she is not on ACE/ARB and denies increased appetite, paresthesia of the feet, polydipsia and polyuria. Last A1C in the office was:  Lab Results  Component Value Date   HGBA1C 5.8 (H) 05/04/2015   Patient is on Vitamin D supplement.   Lab Results  Component Value Date   VD25OH 70 05/04/2015     She is on thyroid medication for history of thyroid cancer s/p thyroidectomy in 2012, follows with Dr. Chalmers Cater. Her medication was changed last visit due to palpitations, was decreased to 125 and she states she is doing better.  Lab Results  Component Value Date   TSH 0.09 (L) 10/08/2015   BMI is Body mass index is 32.34 kg/m., she is working on diet and exercise. Wt Readings from Last 3 Encounters:  05/03/16 209 lb 9.6 oz (95.1 kg)  10/08/15 206 lb (93.4 kg)  05/04/15 199 lb (90.3 kg)    Current Medications:  Current Outpatient Prescriptions on File Prior to Visit  Medication Sig Dispense Refill  . ADVAIR DISKUS 100-50 MCG/DOSE AEPB INHALE ONE DOSE BY MOUTH TWICE  DAILY 180 each 1  . Ascorbic Acid (VITAMIN C) 100 MG CHEW Chew by mouth daily.    Marland Kitchen aspirin 81 MG tablet Take 81 mg by mouth daily.    Marland Kitchen b complex vitamins tablet Take 1 tablet by mouth daily.    . Calcium Carbonate-Vitamin D (CALCIUM + D PO) Take by mouth.    . cholecalciferol (VITAMIN D) 1000 UNITS tablet Take 1,000 Units by mouth 2 (two) times daily.    . Coenzyme Q10 (CO Q 10 PO) Take 200 mg by mouth.    . ferrous sulfate 325 (65 FE) MG tablet Take 325 mg by mouth daily with breakfast.    . Glucosamine-Chondroitin (OSTEO BI-FLEX REGULAR STRENGTH PO) Take by mouth daily.    Javier Docker Oil 1000 MG CAPS Take by mouth  daily.    Marland Kitchen levothyroxine (SYNTHROID, LEVOTHROID) 125 MCG tablet TAKE ONE TABLET BY MOUTH ONCE DAILY BEFORE  BREAKFAST 90 tablet 1  . Multiple Vitamin (MULTIVITAMIN) tablet Take 1 tablet by mouth daily.    . Nutritional Supplements (OSTEO ADVANCE PO) Take by mouth.    Marland Kitchen OVER THE COUNTER MEDICATION octivite    . Zinc 50 MG TABS Take by mouth daily.     No current facility-administered medications on file prior to visit.    Allergies:  No Known Allergies   Medical History:  She has Hypertension; Hyperlipidemia; GERD (gastroesophageal reflux disease); Asthma; Anemia; Migraine; Depression; Vitamin D deficiency; and Hypothyroidism on her problem list.   Health Maintenance:   Immunization History  Administered Date(s) Administered  . Influenza,inj,quad, With Preservative 02/09/2016  . Influenza-Unspecified 02/10/2012, 01/29/2014  . PPD Test 03/23/2014  . Pneumococcal-Unspecified 03/05/2008  . Tdap 02/17/2007   Tetanus: 2008 Pneumovax:2009 Prevnar due 65 Flu vaccine: 2017 Zostavax: Can check with insurance  No LMP recorded. Patient is not currently having periods (Reason: Perimenopausal).Last one was oct.  Pap: 2015 never abnormal pap MGM: 06/2015  left breast repeat 6 month FU normal CAT B Colonoscopy: 2015 RAI 2012 Whole body scan 05/2015 Last Dental Exam:  Dr. Randol Kern 2017 Last Eye Exam:  Dr. Einar Gip 2017  Patient Care Team: Unk Pinto, MD as PCP - General (Internal Medicine) Webb Laws, OD as Referring Physician (Optometry) Jacelyn Pi, MD as Consulting Physician (Endocrinology)  Surgical History:  She has a past surgical history that includes Appendectomy; Total thyroidectomy (06/2010); and Mandible surgery. Family History:  Herfamily history includes Cancer in her maternal aunt; Diabetes in her paternal grandmother; Stroke in her paternal grandfather. Social History:  She reports that she quit smoking about 28 years ago. Her smoking use included  Cigarettes. She has a 5.00 pack-year smoking history. She has never used smokeless tobacco. She reports that she does not drink alcohol or use drugs.  Review of Systems: Review of Systems  Constitutional: Negative.   HENT: Negative.   Eyes: Negative.   Respiratory: Negative.   Cardiovascular: Negative.   Gastrointestinal: Negative.   Genitourinary: Negative.   Musculoskeletal: Negative.   Skin: Negative.   Neurological: Negative.   Endo/Heme/Allergies: Negative.   Psychiatric/Behavioral: Negative.     Physical Exam: Estimated body mass index is 32.34 kg/m as calculated from the following:   Height as of this encounter: 5' 7.5" (1.715 m).   Weight as of this encounter: 209 lb 9.6 oz (95.1 kg). BP 120/82   Pulse 81   Temp 97.7 F (36.5 C)   Resp 16   Ht 5' 7.5" (1.715 m)   Wt 209 lb 9.6 oz (95.1  kg)   SpO2 99%   BMI 32.34 kg/m  General Appearance: Well nourished, in no apparent distress.  Eyes: PERRLA, EOMs, conjunctiva no swelling or erythema, normal fundi and vessels.  Sinuses: No Frontal/maxillary tenderness  ENT/Mouth: Ext aud canals clear, normal light reflex with TMs without erythema, bulging. Good dentition. No erythema, swelling, or exudate on post pharynx. Tonsils not swollen or erythematous. Hearing normal.  Neck: Supple, thyroid normal. No bruits  Respiratory: Respiratory effort normal, BS equal bilaterally without rales, rhonchi, wheezing or stridor.  Cardio: RRR without murmurs, rubs or gallops. Brisk peripheral pulses without edema.  Chest: symmetric, with normal excursions and percussion.  Breasts: Symmetric, without lumps, nipple discharge, retractions.  Abdomen: Soft, nontender, no guarding, rebound, hernias, masses, or organomegaly.  Lymphatics: Non tender without lymphadenopathy.  Genitourinary:  Musculoskeletal: Full ROM all peripheral extremities,5/5 strength, and normal gait.  Skin: Warm, dry without rashes, lesions, ecchymosis. Neuro: Cranial nerves  intact, reflexes equal bilaterally. Normal muscle tone, no cerebellar symptoms. Sensation intact.  Psych: Awake and oriented X 3, normal affect, Insight and Judgment appropriate.   EKG: WNL no ST changes. AORTA SCAN: defer  Vicie Mutters 9:18 AM [facility]

## 2016-05-03 NOTE — Patient Instructions (Addendum)
Check with insurance with about shingles vaccine Try the melatonin 5mg -20mg  dissolvable or gummy 30 mins before bed   Simple math prevails.    1st - exercise does not produce significant weight loss - at best one converts fat into muscle , "bulks up", loses inches, but usually stays "weight neutral"     2nd - think of your body weightas a check book: If you eat more calories than you burn up - you save money or gain weight .... Or if you spend more money than you put in the check book, ie burn up more calories than you eat, then you lose weight     3rd - if you walk or run 1 mile, you burn up 100 calories - you have to burn up 3,500 calories to lose 1 pound, ie you have to walk/run 35 miles to lose 1 measly pound. So if you want to lose 10 #, then you have to walk/run 350 miles, so.... clearly exercise is not the solution.     4. So if you consume 1,500 calories, then you have to burn up the equivalent of 15 miles to stay weight neutral - It also stands to reason that if you consume 1,500 cal/day and don't lose weight, then you must be burning up about 1,500 cals/day to stay weight neutral.     5. If you really want to lose weight, you must cut your calorie intake 300 calories /day and at that rate you should lose about 1 # every 3 days.   6. Please purchase Dr Fara Olden Fuhrman's book(s) "The End of Dieting" & "Eat to Live" . It has some great concepts and recipes.

## 2016-05-04 LAB — CYTOLOGY - PAP

## 2016-05-04 LAB — URINALYSIS, ROUTINE W REFLEX MICROSCOPIC
Bilirubin Urine: NEGATIVE
GLUCOSE, UA: NEGATIVE
Hgb urine dipstick: NEGATIVE
Ketones, ur: NEGATIVE
LEUKOCYTES UA: NEGATIVE
NITRITE: NEGATIVE
PH: 7 (ref 5.0–8.0)
Protein, ur: NEGATIVE
Specific Gravity, Urine: 1.018 (ref 1.001–1.035)

## 2016-05-04 LAB — MAGNESIUM: Magnesium: 2.2 mg/dL (ref 1.5–2.5)

## 2016-05-04 LAB — HIV ANTIBODY (ROUTINE TESTING W REFLEX): HIV 1&2 Ab, 4th Generation: NONREACTIVE

## 2016-05-04 LAB — MICROALBUMIN / CREATININE URINE RATIO
Creatinine, Urine: 137 mg/dL (ref 20–320)
MICROALB UR: 0.4 mg/dL
Microalb Creat Ratio: 3 mcg/mg creat (ref <30)

## 2016-05-04 LAB — VITAMIN D 25 HYDROXY (VIT D DEFICIENCY, FRACTURES): Vit D, 25-Hydroxy: 43 ng/mL (ref 30–100)

## 2016-05-30 ENCOUNTER — Ambulatory Visit (INDEPENDENT_AMBULATORY_CARE_PROVIDER_SITE_OTHER): Payer: 59 | Admitting: Podiatry

## 2016-05-30 ENCOUNTER — Ambulatory Visit (INDEPENDENT_AMBULATORY_CARE_PROVIDER_SITE_OTHER): Payer: 59

## 2016-05-30 ENCOUNTER — Encounter: Payer: Self-pay | Admitting: Podiatry

## 2016-05-30 DIAGNOSIS — M722 Plantar fascial fibromatosis: Secondary | ICD-10-CM | POA: Diagnosis not present

## 2016-05-30 MED ORDER — METHYLPREDNISOLONE 4 MG PO TBPK: ORAL_TABLET | ORAL | 0 refills | Status: DC

## 2016-05-30 MED ORDER — MELOXICAM 15 MG PO TABS: 15.0000 mg | ORAL_TABLET | Freq: Every day | ORAL | 3 refills | Status: DC

## 2016-05-30 NOTE — Progress Notes (Signed)
She presents today with chief complaint of pain to the right foot and ankle states that she's been having some pain on the bottom of her heel on the top of her foot. She states that it really hurts particularly in the mornings.  Objective: Vital signs are stable alert and oriented 3. Pulses are palpable. She has pain on palpation medial tubercle of the right heel. Radiographs taken today do demonstrates small plantar distally oriented calcaneal heel spur of the soft tissue increase in density of the plantar fascia calcaneal insertion site of the right heel. No lesions or wounds are noted.  Assessment: Fasciitis right foot.  Plan: Injected the right heel today with Kenalog and local anesthetics start her on a Medrol Dosepak to be followed by meloxicam. Posterior plantar fascia brace and a night splint. Discussed appropriate shoe gear stretching exercises ice therapy and shoe modifications. Follow-up with her in 1 month

## 2016-05-30 NOTE — Patient Instructions (Signed)

## 2016-06-27 ENCOUNTER — Ambulatory Visit: Payer: 59 | Admitting: Podiatry

## 2016-07-20 ENCOUNTER — Telehealth: Payer: Self-pay

## 2016-07-20 NOTE — Telephone Encounter (Signed)
Per pt's request Shot record was faxed to: [facility] [phone] on 22nd March 2018 @ 10:30am by DD

## 2016-08-10 ENCOUNTER — Other Ambulatory Visit: Payer: Self-pay | Admitting: *Deleted

## 2016-08-10 MED ORDER — LEVOTHYROXINE SODIUM 125 MCG PO TABS: ORAL_TABLET | ORAL | 1 refills | Status: DC

## 2016-10-03 ENCOUNTER — Ambulatory Visit (INDEPENDENT_AMBULATORY_CARE_PROVIDER_SITE_OTHER): Payer: 59 | Admitting: Internal Medicine

## 2016-10-03 VITALS — BP 122/74 | HR 60 | Temp 97.7°F | Resp 16 | Ht 67.0 in | Wt 204.0 lb

## 2016-10-05 NOTE — Progress Notes (Signed)
RESCHEDULED

## 2017-01-04 ENCOUNTER — Other Ambulatory Visit: Payer: Self-pay | Admitting: Physician Assistant

## 2017-01-19 ENCOUNTER — Other Ambulatory Visit: Payer: Self-pay | Admitting: Internal Medicine

## 2017-03-25 DIAGNOSIS — Z23 Encounter for immunization: Secondary | ICD-10-CM | POA: Diagnosis not present

## 2017-05-02 ENCOUNTER — Other Ambulatory Visit: Payer: Self-pay | Admitting: Internal Medicine

## 2017-05-02 DIAGNOSIS — R921 Mammographic calcification found on diagnostic imaging of breast: Secondary | ICD-10-CM

## 2017-05-04 ENCOUNTER — Encounter: Payer: Self-pay | Admitting: Physician Assistant

## 2017-05-04 ENCOUNTER — Ambulatory Visit (INDEPENDENT_AMBULATORY_CARE_PROVIDER_SITE_OTHER): Payer: 59 | Admitting: Physician Assistant

## 2017-05-04 VITALS — BP 110/70 | HR 70 | Temp 97.3°F | Resp 16 | Ht 67.0 in | Wt 189.6 lb

## 2017-05-04 DIAGNOSIS — I10 Essential (primary) hypertension: Secondary | ICD-10-CM

## 2017-05-04 DIAGNOSIS — E785 Hyperlipidemia, unspecified: Secondary | ICD-10-CM | POA: Diagnosis not present

## 2017-05-04 DIAGNOSIS — E039 Hypothyroidism, unspecified: Secondary | ICD-10-CM | POA: Diagnosis not present

## 2017-05-04 DIAGNOSIS — D649 Anemia, unspecified: Secondary | ICD-10-CM

## 2017-05-04 DIAGNOSIS — J45909 Unspecified asthma, uncomplicated: Secondary | ICD-10-CM

## 2017-05-04 DIAGNOSIS — F3342 Major depressive disorder, recurrent, in full remission: Secondary | ICD-10-CM

## 2017-05-04 DIAGNOSIS — Z0000 Encounter for general adult medical examination without abnormal findings: Secondary | ICD-10-CM | POA: Diagnosis not present

## 2017-05-04 DIAGNOSIS — Z0001 Encounter for general adult medical examination with abnormal findings: Secondary | ICD-10-CM

## 2017-05-04 DIAGNOSIS — E559 Vitamin D deficiency, unspecified: Secondary | ICD-10-CM

## 2017-05-04 DIAGNOSIS — G43909 Migraine, unspecified, not intractable, without status migrainosus: Secondary | ICD-10-CM

## 2017-05-04 DIAGNOSIS — K2100 Gastro-esophageal reflux disease with esophagitis, without bleeding: Secondary | ICD-10-CM

## 2017-05-04 DIAGNOSIS — M2550 Pain in unspecified joint: Secondary | ICD-10-CM

## 2017-05-04 DIAGNOSIS — Z79899 Other long term (current) drug therapy: Secondary | ICD-10-CM

## 2017-05-04 NOTE — Patient Instructions (Addendum)
Tumeric with black pepper extract is a great natural antiinflammatory that helps with arthritis and aches and pain. Can get from [employer] or any health food store. Need to take at least 800mg  twice a day with food.   You can take tylenol (500mg ) or tylenol arthritis (650mg ) with the meloxicam/antiinflammatories. The max you can take of tylenol a day is 3000mg  daily, this is a max of 6 pills a day of the regular tyelnol (500mg ) or a max of 4 a day of the tylenol arthritis (650mg ) as long as no other medications you are taking contain tylenol.   Check out vionic shoes    Piriformis Syndrome Rehab Ask your health care provider which exercises are safe for you. Do exercises exactly as told by your health care provider and adjust them as directed. It is normal to feel mild stretching, pulling, tightness, or discomfort as you do these exercises, but you should stop right away if you feel sudden pain or your pain gets worse.Do not begin these exercises until told by your health care provider. Stretching and range of motion exercises These exercises warm up your muscles and joints and improve the movement and flexibility of your hip and pelvis. These exercises also help to relieve pain, numbness, and tingling. Exercise A: Hip rotators  1. Lie on your back on a firm surface. 2. Pull your left / right knee toward your same shoulder with your left / right hand until your knee is pointing toward the ceiling. Hold your left / right ankle with your other hand. 3. Keeping your knee steady, gently pull your left / right ankle toward your other shoulder until you feel a stretch in your buttocks. 4. Hold this position for __________ seconds. Repeat __________ times. Complete this stretch __________ times a day. Exercise B: Hip extensors 1. Lie on your back on a firm surface. Both of your legs should be straight. 2. Pull your left / right knee to your chest. Hold your leg in this position by holding onto the back of  your thigh or the front of your knee. 3. Hold this position for __________ seconds. 4. Slowly return to the starting position. Repeat __________ times. Complete this stretch __________ times a day. Strengthening exercises These exercises build strength and endurance in your hip and thigh muscles. Endurance is the ability to use your muscles for a long time, even after they get tired. Exercise C: Straight leg raises ( hip abductors) 1. Lie on your side with your left / right leg in the top position. Lie so your head, shoulder, knee, and hip line up. Bend your bottom knee to help you balance. 2. Lift your top leg up 4-6 inches (10-15 cm), keeping your toes pointed straight ahead. 3. Hold this position for __________ seconds. 4. Slowly lower your leg to the starting position. Let your muscles relax completely. Repeat __________ times. Complete this exercise__________ times a day. Exercise D: Hip abductors and rotators, quadruped  1. Get on your hands and knees on a firm, lightly padded surface. Your hands should be directly below your shoulders, and your knees should be directly below your hips. 2. Lift your left / right knee out to the side. Keep your knee bent. Do not twist your body. 3. Hold this position for __________ seconds. 4. Slowly lower your leg. Repeat __________ times. Complete this exercise__________ times a day. Exercise E: Straight leg raises ( hip extensors) 1. Lie on your abdomen on a bed or a firm surface with  a pillow under your hips. 2. Squeeze your buttock muscles and lift your left / right thigh off the bed. Do not let your back arch. 3. Hold this position for __________ seconds. 4. Slowly return to the starting position. Let your muscles relax completely before doing another repetition. Repeat __________ times. Complete this exercise__________ times a day. This information is not intended to replace advice given to you by your health care provider. Make sure you  discuss any questions you have with your health care provider. Document Released: 04/17/2005 Document Revised: 12/21/2015 Document Reviewed: 03/30/2015 Elsevier Interactive Patient Education  [employer].

## 2017-05-04 NOTE — Progress Notes (Signed)
Complete Physical  Assessment and Plan:  Essential hypertension - continue medications, DASH diet, exercise and monitor at home. Call if greater than 130/80.  -     CBC with Differential/Platelet -     BASIC METABOLIC PANEL WITH GFR -     Hepatic function panel -     TSH -     Urinalysis, Routine w reflex microscopic -     Microalbumin / creatinine urine ratio -     Cancel: EKG 12-Lead  Hypothyroidism, unspecified type Hypothyroidism-check TSH level, continue medications the same, reminded to take on an empty stomach 30-54mins before food.  -     TSH  Hyperlipidemia, unspecified hyperlipidemia type -continue medications, check lipids, decrease fatty foods, increase activity.  -     Lipid panel  Migraine without status migrainosus, not intractable, unspecified migraine type Avoid triggers  Uncomplicated asthma, unspecified asthma severity, unspecified whether persistent Controlled  Gastroesophageal reflux disease with esophagitis Continue PPI/H2 blocker, diet discussed  Anemia, unspecified type -     CBC with Differential/Platelet  Recurrent major depressive disorder, in full remission ([facility]) - continue medications, stress management techniques discussed, increase water, good sleep hygiene discussed, increase exercise, and increase veggies.   Vitamin D deficiency -     VITAMIN D 25 Hydroxy (Vit-D Deficiency, Fractures)  Encounter for general adult medical examination with abnormal findings 1 year  Medication management -     Magnesium  Polyarthralgia -     Sedimentation rate - possible from working out but with bilateral pain will get sed rate - stretches shown, get on tumeric.    Discussed med's effects and SE's. Screening labs and tests as requested with regular follow-up as recommended. Over 40 minutes of exam, counseling, chart review, and complex, high level critical decision making was performed this visit.   HPI  57 y.o. female  presents for a complete  physical and follow up for has Hypertension; Hyperlipidemia; GERD (gastroesophageal reflux disease); Asthma; Anemia; Migraine; Depression; Vitamin D deficiency; and Hypothyroidism on their problem list..  Has been without a menses for over a year.   Her blood pressure has been controlled at home, today their BP is BP: 110/70 She does workout. She denies chest pain, shortness of breath, dizziness.   She is not on cholesterol medication and denies myalgias. Her cholesterol is at goal. The cholesterol last visit was:   Lab Results  Component Value Date   CHOL 221 (H) 05/03/2016   HDL 77 05/03/2016   LDLCALC 118 (H) 05/03/2016   TRIG 129 05/03/2016   CHOLHDL 2.9 05/03/2016    Last A1C in the office was:  Lab Results  Component Value Date   HGBA1C 5.5 05/03/2016   Patient is on Vitamin D supplement.   Lab Results  Component Value Date   VD25OH 51 05/03/2016     She is on thyroid medication for history of thyroid cancer s/p thyroidectomy in 2012. Her medication was changed last visit due to palpitations, 125 mcg daily and she states she is doing better.  Lab Results  Component Value Date   TSH 0.53 05/03/2016   BMI is Body mass index is 29.7 kg/m., she is working on diet and exercise. She got a new job [employer].  Wt Readings from Last 3 Encounters:  05/04/17 189 lb 9.6 oz (86 kg)  10/03/16 204 lb (92.5 kg)  05/03/16 209 lb 9.6 oz (95.1 kg)    Current Medications:  Current Outpatient Medications on File Prior to Visit  Medication Sig Dispense Refill  . ADVAIR DISKUS 100-50 MCG/DOSE AEPB INHALE ONE DOSE BY MOUTH TWICE DAILY 180 each 1  . Ascorbic Acid (VITAMIN C) 100 MG CHEW Chew by mouth daily.    Marland Kitchen aspirin 81 MG tablet Take 81 mg by mouth daily.    Marland Kitchen b complex vitamins tablet Take 1 tablet by mouth daily.    . Calcium Carbonate-Vitamin D (CALCIUM + D PO) Take by mouth.    . cholecalciferol (VITAMIN D) 1000 UNITS tablet Take 1,000 Units by mouth 2 (two) times daily.    .  Coenzyme Q10 (CO Q 10 PO) Take 200 mg by mouth.    . ferrous sulfate 325 (65 FE) MG tablet Take 325 mg by mouth daily with breakfast.    . Glucosamine-Chondroitin (OSTEO BI-FLEX REGULAR STRENGTH PO) Take by mouth daily.    Javier Docker Oil 1000 MG CAPS Take by mouth daily.    Marland Kitchen levothyroxine (SYNTHROID, LEVOTHROID) 125 MCG tablet TAKE 1 TABLET BY MOUTH ONCE DAILY BEFORE BREAKFAST 90 tablet 1  . Multiple Vitamin (MULTIVITAMIN) tablet Take 1 tablet by mouth daily.    . Nutritional Supplements (OSTEO ADVANCE PO) Take by mouth.    . Zinc 50 MG TABS Take by mouth daily.     No current facility-administered medications on file prior to visit.    Allergies:  No Known Allergies   Medical History:  She has Hypertension; Hyperlipidemia; GERD (gastroesophageal reflux disease); Asthma; Anemia; Migraine; Depression; Vitamin D deficiency; and Hypothyroidism on their problem list.   Health Maintenance:   Immunization History  Administered Date(s) Administered  . Influenza,inj,quad, With Preservative 02/09/2016  . Influenza-Unspecified 02/10/2012, 01/29/2014  . PPD Test 03/23/2014  . Pneumococcal-Unspecified 03/05/2008  . Tdap 02/17/2007, 05/03/2016   Tetanus: 2018 Pneumovax:2009 Prevnar due 66 Flu vaccine: 2018 at [place] Nov 23rd Zostavax:   No LMP recorded. Patient is not currently having periods (Reason: Perimenopausal). Pap: 2018 never abnormal pap neg HPV will repeat 5 years MGM: 06/2015  left breast repeat 6 month FU normal CAT B has OV the 21st for that Colonoscopy: 2018 RAI 2012 Whole body scan 05/2015 Last Dental Exam:  Dr. Randol Kern 2017 Last Eye Exam:  Dr. Einar Gip 2017  Patient Care Team: Unk Pinto, MD as PCP - General (Internal Medicine) Webb Laws, [place] as Referring Physician (Optometry) Jacelyn Pi, MD as Consulting Physician (Endocrinology)  Surgical History:  She has a past surgical history that includes Appendectomy; Total thyroidectomy (06/2010); and  Mandible surgery. Family History:  Herfamily history includes Cancer in her maternal aunt; Diabetes in her paternal grandmother; Stroke in her paternal grandfather. Social History:  She reports that she quit smoking about 29 years ago. Her smoking use included cigarettes. She has a 5.00 pack-year smoking history. she has never used smokeless tobacco. She reports that she does not drink alcohol or use drugs.  Review of Systems: Review of Systems  Constitutional: Negative.   HENT: Negative.   Eyes: Negative.   Respiratory: Negative.   Cardiovascular: Negative.   Gastrointestinal: Negative.   Genitourinary: Negative.   Musculoskeletal: Negative.   Skin: Negative.   Neurological: Negative.   Endo/Heme/Allergies: Negative.   Psychiatric/Behavioral: Negative.     Physical Exam: Estimated body mass index is 29.7 kg/m as calculated from the following:   Height as of this encounter: 5\' 7"  (1.702 m).   Weight as of this encounter: 189 lb 9.6 oz (86 kg). BP 110/70   Pulse 70   Temp (!) 97.3 F (36.3 C)  Resp 16   Ht 5\' 7"  (1.702 m)   Wt 189 lb 9.6 oz (86 kg)   SpO2 98%   BMI 29.70 kg/m  General Appearance: Well nourished, in no apparent distress.  Eyes: PERRLA, EOMs, conjunctiva no swelling or erythema, normal fundi and vessels.  Sinuses: No Frontal/maxillary tenderness  ENT/Mouth: Ext aud canals clear, normal light reflex with TMs without erythema, bulging. Good dentition. No erythema, swelling, or exudate on post pharynx. Tonsils not swollen or erythematous. Hearing normal.  Neck: Supple, thyroid normal. No bruits  Respiratory: Respiratory effort normal, BS equal bilaterally without rales, rhonchi, wheezing or stridor.  Cardio: RRR without murmurs, rubs or gallops. Brisk peripheral pulses without edema.  Chest: symmetric, with normal excursions and percussion.  Breasts: Symmetric, without lumps, nipple discharge, retractions.  Abdomen: Soft, nontender, no guarding, rebound,  hernias, masses, or organomegaly.  Lymphatics: Non tender without lymphadenopathy.  Genitourinary: defer Musculoskeletal: Full ROM all peripheral extremities,5/5 strength, and normal gait.  Skin: Warm, dry without rashes, lesions, ecchymosis. Neuro: Cranial nerves intact, reflexes equal bilaterally. Normal muscle tone, no cerebellar symptoms. Sensation intact.  Psych: Awake and oriented X 3, normal affect, Insight and Judgment appropriate.   EKG: defer AORTA SCAN: defer  Vicie Mutters 9:55 AM [facility]

## 2017-05-05 LAB — CBC WITH DIFFERENTIAL/PLATELET
BASOS PCT: 1 %
Basophils Absolute: 62 cells/uL (ref 0–200)
Eosinophils Absolute: 180 cells/uL (ref 15–500)
Eosinophils Relative: 2.9 %
HEMATOCRIT: 41.3 % (ref 35.0–45.0)
HEMOGLOBIN: 13.8 g/dL (ref 11.7–15.5)
LYMPHS ABS: 1662 {cells}/uL (ref 850–3900)
MCH: 30.1 pg (ref 27.0–33.0)
MCHC: 33.4 g/dL (ref 32.0–36.0)
MCV: 90 fL (ref 80.0–100.0)
MPV: 10.5 fL (ref 7.5–12.5)
Monocytes Relative: 8.2 %
Neutro Abs: 3788 cells/uL (ref 1500–7800)
Neutrophils Relative %: 61.1 %
Platelets: 292 10*3/uL (ref 140–400)
RBC: 4.59 10*6/uL (ref 3.80–5.10)
RDW: 12.2 % (ref 11.0–15.0)
Total Lymphocyte: 26.8 %
WBC: 6.2 10*3/uL (ref 3.8–10.8)
WBCMIX: 508 {cells}/uL (ref 200–950)

## 2017-05-05 LAB — LIPID PANEL
Cholesterol: 253 mg/dL — ABNORMAL HIGH (ref <200)
HDL: 90 mg/dL (ref >50)
LDL CHOLESTEROL (CALC): 147 mg/dL — AB
Non-HDL Cholesterol (Calc): 163 mg/dL (calc) — ABNORMAL HIGH (ref <130)
TRIGLYCERIDES: 64 mg/dL (ref <150)
Total CHOL/HDL Ratio: 2.8 (calc) (ref <5.0)

## 2017-05-05 LAB — VITAMIN D 25 HYDROXY (VIT D DEFICIENCY, FRACTURES): Vit D, 25-Hydroxy: 43 ng/mL (ref 30–100)

## 2017-05-05 LAB — URINALYSIS, ROUTINE W REFLEX MICROSCOPIC
BILIRUBIN URINE: NEGATIVE
GLUCOSE, UA: NEGATIVE
Hgb urine dipstick: NEGATIVE
Ketones, ur: NEGATIVE
Leukocytes, UA: NEGATIVE
Nitrite: NEGATIVE
PROTEIN: NEGATIVE
SPECIFIC GRAVITY, URINE: 1.014 (ref 1.001–1.03)
pH: 6.5 (ref 5.0–8.0)

## 2017-05-05 LAB — TSH: TSH: 4.02 m[IU]/L

## 2017-05-05 LAB — HEPATIC FUNCTION PANEL
AG Ratio: 2 (calc) (ref 1.0–2.5)
ALBUMIN MSPROF: 4.9 g/dL (ref 3.6–5.1)
ALT: 20 U/L (ref 6–29)
AST: 19 U/L (ref 10–35)
Alkaline phosphatase (APISO): 67 U/L (ref 33–130)
BILIRUBIN DIRECT: 0.1 mg/dL (ref 0.0–0.2)
BILIRUBIN TOTAL: 0.7 mg/dL (ref 0.2–1.2)
GLOBULIN: 2.5 g/dL (ref 1.9–3.7)
Indirect Bilirubin: 0.6 mg/dL (calc) (ref 0.2–1.2)
Total Protein: 7.4 g/dL (ref 6.1–8.1)

## 2017-05-05 LAB — BASIC METABOLIC PANEL WITH GFR
BUN: 22 mg/dL (ref 7–25)
CALCIUM: 10 mg/dL (ref 8.6–10.4)
CHLORIDE: 106 mmol/L (ref 98–110)
CO2: 29 mmol/L (ref 20–32)
CREATININE: 0.76 mg/dL (ref 0.50–1.05)
GFR, EST NON AFRICAN AMERICAN: 88 mL/min/{1.73_m2} (ref >=60)
GFR, Est African American: 102 mL/min/{1.73_m2} (ref >=60)
Glucose, Bld: 98 mg/dL (ref 65–99)
Potassium: 4.1 mmol/L (ref 3.5–5.3)
Sodium: 143 mmol/L (ref 135–146)

## 2017-05-05 LAB — MICROALBUMIN / CREATININE URINE RATIO
Creatinine, Urine: 66 mg/dL (ref 20–275)
MICROALB/CREAT RATIO: 3 ug/mg{creat} (ref <30)
Microalb, Ur: 0.2 mg/dL

## 2017-05-05 LAB — SEDIMENTATION RATE: Sed Rate: 11 mm/h (ref 0–30)

## 2017-05-05 LAB — MAGNESIUM: MAGNESIUM: 2.5 mg/dL (ref 1.5–2.5)

## 2017-05-07 ENCOUNTER — Encounter: Payer: Self-pay | Admitting: Physician Assistant

## 2017-05-21 ENCOUNTER — Ambulatory Visit
Admission: RE | Admit: 2017-05-21 | Discharge: 2017-05-21 | Disposition: A | Discharge status: Home / Self Care | Payer: 59 | Source: Ambulatory Visit | Attending: Internal Medicine | Admitting: Internal Medicine

## 2017-05-21 DIAGNOSIS — R921 Mammographic calcification found on diagnostic imaging of breast: Secondary | ICD-10-CM

## 2017-06-29 ENCOUNTER — Other Ambulatory Visit: Payer: Self-pay | Admitting: Internal Medicine

## 2017-11-22 ENCOUNTER — Ambulatory Visit: Payer: 59

## 2017-11-22 ENCOUNTER — Encounter: Payer: 59 | Admitting: Podiatry

## 2017-11-26 ENCOUNTER — Telehealth: Payer: Self-pay | Admitting: Podiatry

## 2017-11-26 NOTE — Telephone Encounter (Signed)
Pt states she called and left a message several days before her appt on 7.25.19 so she should not be considered a n/s

## 2017-11-29 NOTE — Progress Notes (Signed)
This encounter was created in error - please disregard.

## 2018-01-01 ENCOUNTER — Other Ambulatory Visit: Payer: Self-pay | Admitting: Physician Assistant

## 2018-02-04 NOTE — Progress Notes (Signed)
FOLLOW UP  Assessment and Plan:   Hypertension -Continue medication, monitor blood pressure at home. Continue DASH diet.  Reminder to go to the ER if any CP, SOB, nausea, dizziness, severe HA, changes vision/speech, left arm numbness and tingling and jaw pain.  Cholesterol -Continue diet and exercise. Check cholesterol.   Hypothyroidism -check TSH level, continue medications the same, reminded to take on an empty stomach 30-36mins before food.    Continue diet and meds as discussed. Further disposition pending results of labs. Over 30 minutes of exam, counseling, chart review, and critical decision making was performed  Future Appointments  Date Time Provider [place]  05/20/2018  9:00 AM Vicie Mutters, PA-C [id number] None     HPI 57 y.o. female  presents for 3 month follow up on hypertension, cholesterol, prediabetes, and vitamin D deficiency.   Her blood pressure has been controlled at home, today their BP is BP: 112/78   She does workout. She denies chest pain, shortness of breath, dizziness.  BMI is Body mass index is 30.13 kg/m., she is working on diet and exercise. Wt Readings from Last 3 Encounters:  02/06/18 192 lb 6.4 oz (87.3 kg)  05/04/17 189 lb 9.6 oz (86 kg)  10/03/16 204 lb (92.5 kg)     She  is not  on cholesterol medication and denies myalgias. Her cholesterol is not at goal. The cholesterol last visit was:   Lab Results  Component Value Date   CHOL 253 (H) 05/04/2017   HDL 90 05/04/2017   LDLCALC 147 (H) 05/04/2017   TRIG 64 05/04/2017   CHOLHDL 2.8 05/04/2017    She has been working on diet and exercise for prediabetes, and denies paresthesia of the feet, polydipsia, polyuria and visual disturbances. Last A1C in the office was:  Lab Results  Component Value Date   HGBA1C 5.5 05/03/2016   Patient is on Vitamin D supplement.   Lab Results  Component Value Date   VD25OH 43 05/04/2017     She is on thyroid medication. Her medication was  not changed last visit.   Lab Results  Component Value Date   TSH 4.02 05/04/2017  .    Current Medications:  Current Outpatient Medications on File Prior to Visit  Medication Sig  . ADVAIR DISKUS 100-50 MCG/DOSE AEPB INHALE ONE DOSE BY MOUTH TWICE DAILY  . Ascorbic Acid (VITAMIN C) 100 MG CHEW Chew by mouth daily.  Marland Kitchen aspirin 81 MG tablet Take 81 mg by mouth daily.  Marland Kitchen b complex vitamins tablet Take 1 tablet by mouth daily.  . Calcium Carbonate-Vitamin D (CALCIUM + D PO) Take by mouth.  . cholecalciferol (VITAMIN D) 1000 UNITS tablet Take 1,000 Units by mouth 2 (two) times daily.  . Coenzyme Q10 (CO Q 10 PO) Take 200 mg by mouth.  . ferrous sulfate 325 (65 FE) MG tablet Take 325 mg by mouth daily with breakfast.  . Glucosamine-Chondroitin (OSTEO BI-FLEX REGULAR STRENGTH PO) Take by mouth daily.  Javier Docker Oil 1000 MG CAPS Take by mouth daily.  . Multiple Vitamin (MULTIVITAMIN) tablet Take 1 tablet by mouth daily.  . Nutritional Supplements (OSTEO ADVANCE PO) Take by mouth.  . Zinc 50 MG TABS Take by mouth daily.   No current facility-administered medications on file prior to visit.     Medical History:  Past Medical History:  Diagnosis Date  . Anemia    iron  . Asthma   . Depression   . GERD (gastroesophageal reflux disease)   .  Hyperlipidemia   . Hypertension   . Migraine   . 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 Unspecified hypothyroidism 03/06/2013   Secondary to thyroid cancer and RAI treatment  . Vitamin D deficiency    Allergies: No Known Allergies   Review of Systems:  ROS  Family history- Review and unchanged Social history- Review and unchanged Physical Exam: BP 112/78   Pulse 71   Temp 98.2 F (36.8 C)   Resp 16   Ht 5\' 7"  (1.702 m)   Wt 192 lb 6.4 oz (87.3 kg)   SpO2 98%   BMI 30.13 kg/m  Wt Readings from Last 3 Encounters:  02/06/18 192 lb 6.4 oz (87.3 kg)  05/04/17 189 lb 9.6 oz (86 kg)  10/03/16 204 lb (92.5 kg)   General Appearance:  Well nourished, in no apparent distress. Eyes: PERRLA, EOMs, conjunctiva no swelling or erythema Sinuses: No Frontal/maxillary tenderness ENT/Mouth: Ext aud canals clear, TMs without erythema, bulging. No erythema, swelling, or exudate on post pharynx.  Tonsils not swollen or erythematous. Hearing normal.  Neck: Supple, thyroid normal.  Respiratory: Respiratory effort normal, BS equal bilaterally without rales, rhonchi, wheezing or stridor.  Cardio: RRR with no MRGs. Brisk peripheral pulses without edema.  Abdomen: Soft, + BS,  Non tender, no guarding, rebound, hernias, masses. Lymphatics: Non tender without lymphadenopathy.  Musculoskeletal: Full ROM, 5/5 strength, Normal gait Skin: Warm, dry without rashes, lesions, ecchymosis.  Neuro: Cranial nerves intact. Normal muscle tone, no cerebellar symptoms. Psych: Awake and oriented X 3, normal affect, Insight and Judgment appropriate.    Vicie Mutters, PA-C 9:59 AM [facility]

## 2018-02-06 ENCOUNTER — Ambulatory Visit: Payer: 59 | Admitting: Physician Assistant

## 2018-02-06 ENCOUNTER — Encounter: Payer: Self-pay | Admitting: Physician Assistant

## 2018-02-06 ENCOUNTER — Other Ambulatory Visit: Payer: Self-pay

## 2018-02-06 VITALS — BP 112/78 | HR 71 | Temp 98.2°F | Resp 16 | Ht 67.0 in | Wt 192.4 lb

## 2018-02-06 DIAGNOSIS — E559 Vitamin D deficiency, unspecified: Secondary | ICD-10-CM

## 2018-02-06 DIAGNOSIS — F3342 Major depressive disorder, recurrent, in full remission: Secondary | ICD-10-CM | POA: Diagnosis not present

## 2018-02-06 DIAGNOSIS — Z23 Encounter for immunization: Secondary | ICD-10-CM | POA: Diagnosis not present

## 2018-02-06 DIAGNOSIS — E785 Hyperlipidemia, unspecified: Secondary | ICD-10-CM

## 2018-02-06 DIAGNOSIS — E039 Hypothyroidism, unspecified: Secondary | ICD-10-CM | POA: Diagnosis not present

## 2018-02-06 DIAGNOSIS — I10 Essential (primary) hypertension: Secondary | ICD-10-CM | POA: Diagnosis not present

## 2018-02-06 LAB — LIPID PANEL
CHOLESTEROL: 201 mg/dL — AB (ref <200)
HDL: 64 mg/dL (ref >50)
LDL Cholesterol (Calc): 120 mg/dL (calc) — ABNORMAL HIGH
NON-HDL CHOLESTEROL (CALC): 137 mg/dL — AB (ref <130)
Total CHOL/HDL Ratio: 3.1 (calc) (ref <5.0)
Triglycerides: 75 mg/dL (ref <150)

## 2018-02-06 LAB — CBC WITH DIFFERENTIAL/PLATELET
BASOS ABS: 70 {cells}/uL (ref 0–200)
BASOS PCT: 1.4 %
EOS PCT: 5.3 %
Eosinophils Absolute: 265 cells/uL (ref 15–500)
HEMATOCRIT: 40.9 % (ref 35.0–45.0)
HEMOGLOBIN: 13.7 g/dL (ref 11.7–15.5)
LYMPHS ABS: 1495 {cells}/uL (ref 850–3900)
MCH: 30.2 pg (ref 27.0–33.0)
MCHC: 33.5 g/dL (ref 32.0–36.0)
MCV: 90.3 fL (ref 80.0–100.0)
MPV: 10.5 fL (ref 7.5–12.5)
Monocytes Relative: 7.9 %
NEUTROS ABS: 2775 {cells}/uL (ref 1500–7800)
Neutrophils Relative %: 55.5 %
PLATELETS: 270 10*3/uL (ref 140–400)
RBC: 4.53 10*6/uL (ref 3.80–5.10)
RDW: 12 % (ref 11.0–15.0)
Total Lymphocyte: 29.9 %
WBC mixed population: 395 cells/uL (ref 200–950)
WBC: 5 10*3/uL (ref 3.8–10.8)

## 2018-02-06 LAB — COMPLETE METABOLIC PANEL WITH GFR
AG RATIO: 1.6 (calc) (ref 1.0–2.5)
ALKALINE PHOSPHATASE (APISO): 60 U/L (ref 33–130)
ALT: 16 U/L (ref 6–29)
AST: 16 U/L (ref 10–35)
Albumin: 4.3 g/dL (ref 3.6–5.1)
BILIRUBIN TOTAL: 0.5 mg/dL (ref 0.2–1.2)
BUN: 16 mg/dL (ref 7–25)
CHLORIDE: 108 mmol/L (ref 98–110)
CO2: 28 mmol/L (ref 20–32)
Calcium: 9.8 mg/dL (ref 8.6–10.4)
Creat: 0.81 mg/dL (ref 0.50–1.05)
GFR, Est African American: 94 mL/min/{1.73_m2} (ref >=60)
GFR, Est Non African American: 81 mL/min/{1.73_m2} (ref >=60)
Globulin: 2.7 g/dL (calc) (ref 1.9–3.7)
Glucose, Bld: 94 mg/dL (ref 65–99)
POTASSIUM: 5.5 mmol/L — AB (ref 3.5–5.3)
Sodium: 142 mmol/L (ref 135–146)
Total Protein: 7 g/dL (ref 6.1–8.1)

## 2018-02-06 LAB — TSH: TSH: 1.4 m[IU]/L (ref 0.40–4.50)

## 2018-02-06 MED ORDER — LEVOTHYROXINE SODIUM 125 MCG PO TABS: ORAL_TABLET | ORAL | 1 refills | Status: DC

## 2018-02-06 NOTE — Patient Instructions (Signed)
Your LDL could improve, ideally we want it under a 100.  Your LDL is the bad cholesterol that can lead to heart attack and stroke. To lower your number you can decrease your fatty foods, red meat, cheese, milk and increase fiber like whole grains and veggies. You can also add a fiber supplement like Citracel or Benefiber, these do not cause gas and bloating and are safe to use. Especially if you have a strong family history of heart disease or stroke or you have evidence of plaque on any imaging like a chest xray, we may discuss at your next office visit putting you on a medication to get your number below 100.   FIBER SUPPLEMENT  Benefiber or Citracel is good for constipation/diarrhea/irritable bowel syndrome, it helps with weight loss and can help lower your bad cholesterol. Please do 1 TBSP in the morning in water, coffee, or tea. It can take up to a month before you can see a difference with your bowel movements. It is cheapest from [employer], [employer], [employer].    Cholesterol Cholesterol is a white, waxy, fat-like substance that is needed by the human body in small amounts. The liver makes all the cholesterol we need. Cholesterol is carried from the liver by the blood through the blood vessels. Deposits of cholesterol (plaques) may build up on blood vessel (artery) walls. Plaques make the arteries narrower and stiffer. Cholesterol plaques increase the risk for heart attack and stroke. You cannot feel your cholesterol level even if it is very high. The only way to know that it is high is to have a blood test. Once you know your cholesterol levels, you should keep a record of the test results. Work with your health care provider to keep your levels in the desired range. What do the results mean?  Total cholesterol is a rough measure of all the cholesterol in your blood.  LDL (low-density lipoprotein) is the "bad" cholesterol. This is the type that causes plaque to build up on the artery walls. You want  this level to be low.  HDL (high-density lipoprotein) is the "good" cholesterol because it cleans the arteries and carries the LDL away. You want this level to be high.  Triglycerides are fat that the body can either burn for energy or store. High levels are closely linked to heart disease. What are the desired levels of cholesterol?  Total cholesterol below 200.  LDL below 100 for people who are at risk, below 70 for people at very high risk.  HDL above 40 is good. A level of 60 or higher is considered to be protective against heart disease.  Triglycerides below 150. How can I lower my cholesterol? Diet Follow your diet program as told by your health care provider.  Choose fish or white meat chicken and [place], roasted or baked. Limit fatty cuts of red meat, fried foods, and processed meats, such as sausage and lunch meats.  Eat lots of fresh fruits and vegetables.  Choose whole grains, beans, pasta, potatoes, and cereals.  Choose olive oil, corn oil, or canola oil, and use only small amounts.  Avoid butter, mayonnaise, shortening, or palm kernel oils.  Avoid foods with trans fats.  Drink skim or nonfat milk and eat low-fat or nonfat yogurt and cheeses. Avoid whole milk, cream, ice cream, egg yolks, and full-fat cheeses.  Healthier desserts include angel food cake, ginger snaps, animal crackers, hard candy, popsicles, and low-fat or nonfat frozen yogurt. Avoid pastries, cakes, pies, and cookies.  Exercise  Follow your exercise program as told by your health care provider. A regular program: ? Helps to decrease LDL and raise HDL. ? Helps with weight control.  Do things that increase your activity level, such as gardening, walking, and taking the stairs.  Ask your health care provider about ways that you can be more active in your daily life.  Medicine  Take over-the-counter and prescription medicines only as told by your health care provider. ? Medicine may be  prescribed by your health care provider to help lower cholesterol and decrease the risk for heart disease. This is usually done if diet and exercise have failed to bring down cholesterol levels. ? If you have several risk factors, you may need medicine even if your levels are normal.  This information is not intended to replace advice given to you by your health care provider. Make sure you discuss any questions you have with your health care provider. Document Released: 01/10/2001 Document Revised: 11/13/2015 Document Reviewed: 10/16/2015 Elsevier Interactive Patient Education  [employer].

## 2018-02-20 ENCOUNTER — Other Ambulatory Visit: Payer: Self-pay | Admitting: Internal Medicine

## 2018-05-17 ENCOUNTER — Encounter: Payer: Self-pay | Admitting: Physician Assistant

## 2018-05-17 NOTE — Progress Notes (Addendum)
Complete Physical  Assessment and Plan:  Essential hypertension - continue medications, DASH diet, exercise and monitor at home. Call if greater than 130/80.  -     CBC with Differential/Platelet -     BASIC METABOLIC PANEL WITH GFR -     Hepatic function panel -     TSH -     Urinalysis, Routine w reflex microscopic -     Microalbumin / creatinine urine ratio  Hypothyroidism, unspecified type Hypothyroidism-check TSH level, continue medications the same, reminded to take on an empty stomach 30-67mins before food.  -     TSH  Hyperlipidemia, unspecified hyperlipidemia type -continue medications, check lipids, decrease fatty foods, increase activity.  -     Lipid panel  Migraine without status migrainosus, not intractable, unspecified migraine type Avoid triggers  Uncomplicated asthma, unspecified asthma severity, unspecified whether persistent Controlled Refill Advair  Gastroesophageal reflux disease with esophagitis Continue PPI/H2 blocker, diet discussed  Anemia, unspecified type -     CBC with Differential/Platelet  Recurrent major depressive disorder, in full remission ([place]) - continue medications, stress management techniques discussed, increase water, good sleep hygiene discussed, increase exercise, and increase veggies.   Vitamin D deficiency -     VITAMIN D 25 Hydroxy (Vit-D Deficiency, Fractures)  Encounter for general adult medical examination with abnormal findings 1 year  Medication management -     Magnesium   Discussed med's effects and SE's. Screening labs and tests as requested with regular follow-up as recommended. Over 40 minutes of exam, counseling, chart review, and complex, high level critical decision making was performed this visit.   HPI  58 y.o. female  presents for a complete physical and follow up for has Hypertension; Hyperlipidemia; GERD (gastroesophageal reflux disease); Asthma; Anemia; Migraine; Depression; Vitamin D deficiency; and  Hypothyroidism on their problem list..  She has been having leg cramps at night, will feel tightness at night in thighs while sleeping. Taking her iron, drinking plenty of fluids.   Her blood pressure has been controlled at home, today their BP is BP: 126/72 She does workout. She denies chest pain, shortness of breath, dizziness.   She is not on cholesterol medication and denies myalgias. Her cholesterol is at goal. The cholesterol last visit was:   Lab Results  Component Value Date   CHOL 201 (H) 02/06/2018   HDL 64 02/06/2018   LDLCALC 120 (H) 02/06/2018   TRIG 75 02/06/2018   CHOLHDL 3.1 02/06/2018    Last A1C in the office was:  Lab Results  Component Value Date   HGBA1C 5.5 05/03/2016   Patient is on Vitamin D supplement.   Lab Results  Component Value Date   VD25OH 56 05/04/2017     She is on thyroid medication for history of thyroid cancer s/p thyroidectomy in 2012, was following with Dr. Chalmers Cater but was released. Her medication was changed last visit due to palpitations, 125 mcg daily and she states she is doing better.  Lab Results  Component Value Date   TSH 1.40 02/06/2018   BMI is Body mass index is 31.22 kg/m., she is working on diet and exercise. She got a new job [employer].  Wt Readings from Last 3 Encounters:  05/20/18 196 lb 6.4 oz (89.1 kg)  02/06/18 192 lb 6.4 oz (87.3 kg)  05/04/17 189 lb 9.6 oz (86 kg)    Current Medications:  Current Outpatient Medications on File Prior to Visit  Medication Sig Dispense Refill  . Ascorbic Acid (VITAMIN  C) 100 MG CHEW Chew by mouth daily.    Marland Kitchen aspirin 81 MG tablet Take 81 mg by mouth daily.    Marland Kitchen b complex vitamins tablet Take 1 tablet by mouth daily.    . Calcium Carbonate-Vitamin D (CALCIUM + D PO) Take by mouth.    . cholecalciferol (VITAMIN D) 1000 UNITS tablet Take 1,000 Units by mouth 2 (two) times daily.    . Coenzyme Q10 (CO Q 10 PO) Take 200 mg by mouth.    . ferrous sulfate 325 (65 FE) MG tablet Take 325  mg by mouth daily with breakfast.    . Glucosamine-Chondroitin (OSTEO BI-FLEX REGULAR STRENGTH PO) Take by mouth daily.    Javier Docker Oil 1000 MG CAPS Take by mouth daily.    . Multiple Vitamin (MULTIVITAMIN) tablet Take 1 tablet by mouth daily.    . Nutritional Supplements (OSTEO ADVANCE PO) Take by mouth.    . Zinc 50 MG TABS Take by mouth daily.     No current facility-administered medications on file prior to visit.    Allergies:  No Known Allergies   Medical History:  She has Hypertension; Hyperlipidemia; GERD (gastroesophageal reflux disease); Asthma; Anemia; Migraine; Depression; Vitamin D deficiency; and Hypothyroidism on their problem list.   Health Maintenance:   Immunization History  Administered Date(s) Administered  . Influenza Inj Mdck Quad With Preservative 03/25/2017, 02/06/2018  . Influenza,inj,quad, With Preservative 02/09/2016  . Influenza-Unspecified 02/10/2012, 01/29/2014  . PPD Test 03/23/2014  . Pneumococcal-Unspecified 03/05/2008  . Tdap 02/17/2007, 05/03/2016   Tetanus: 2018 Pneumovax:2009 Prevnar due 65 Flu vaccine: 2019 Zostavax:   No LMP recorded. (Menstrual status: Perimenopausal). Pap: 2018 never abnormal pap neg HPV will repeat 5 years MGM: 05/21/2017 normal, due, had normal [place] left breast Colonoscopy: 01/2014 Dr. Fuller Plan adenomas- recall this year RAI 2012 Whole body scan 05/2015 Last Dental Exam:  Dr. Randol Kern  Last Eye Exam:  Dr. Einar Gip   Patient Care Team: Unk Pinto, MD as PCP - General (Internal Medicine) Webb Laws, [place] as Referring Physician (Optometry)  Surgical History:  She has a past surgical history that includes Appendectomy; Total thyroidectomy (06/2010); and Mandible surgery. Family History:  Herfamily history includes Cancer in her maternal aunt; Diabetes in her paternal grandmother; Stroke in her paternal grandfather. Social History:  She reports that she quit smoking about 31 years ago. Her smoking use  included cigarettes. She has a 5.00 pack-year smoking history. She has never used smokeless tobacco. She reports that she does not drink alcohol or use drugs.  Review of Systems: Review of Systems  Constitutional: Negative.   HENT: Negative.   Eyes: Negative.   Respiratory: Negative.   Cardiovascular: Negative.   Gastrointestinal: Negative.   Genitourinary: Negative.   Musculoskeletal: Negative.   Skin: Negative.   Neurological: Negative.   Endo/Heme/Allergies: Negative.   Psychiatric/Behavioral: Negative.     Physical Exam: Estimated body mass index is 31.22 kg/m as calculated from the following:   Height as of this encounter: 5' 6.5" (1.689 m).   Weight as of this encounter: 196 lb 6.4 oz (89.1 kg). BP 126/72   Pulse 73   Temp (!) 97.5 F (36.4 C)   Ht 5' 6.5" (1.689 m)   Wt 196 lb 6.4 oz (89.1 kg)   SpO2 99%   BMI 31.22 kg/m  General Appearance: Well nourished, in no apparent distress.  Eyes: PERRLA, EOMs, conjunctiva no swelling or erythema, normal fundi and vessels.  Sinuses: No Frontal/maxillary tenderness  ENT/Mouth: Ext aud  canals clear, normal light reflex with TMs without erythema, bulging. Good dentition. No erythema, swelling, or exudate on post pharynx. Tonsils not swollen or erythematous. Hearing normal.  Neck: Supple, thyroid normal. No bruits  Respiratory: Respiratory effort normal, BS equal bilaterally without rales, rhonchi, wheezing or stridor.  Cardio: RRR with 1/6 systolic murmur, best hear on RSB, without rubs or gallops. Brisk peripheral pulses without edema.  Chest: symmetric, with normal excursions and percussion.  Breasts: Symmetric, without lumps, nipple discharge, retractions.  Abdomen: Soft, nontender, no guarding, rebound, hernias, masses, or organomegaly.  Lymphatics: Non tender without lymphadenopathy.  Genitourinary: defer Musculoskeletal: Full ROM all peripheral extremities,5/5 strength, and normal gait.  Skin: Warm, dry without rashes,  lesions, ecchymosis. Neuro: Cranial nerves intact, reflexes equal bilaterally. Normal muscle tone, no cerebellar symptoms. Sensation intact.  Psych: Awake and oriented X 3, normal affect, Insight and Judgment appropriate.   EKG: defer AORTA SCAN: defer  Vicie Mutters 9:30 AM [facility]

## 2018-05-20 ENCOUNTER — Encounter: Payer: Self-pay | Admitting: Physician Assistant

## 2018-05-20 ENCOUNTER — Other Ambulatory Visit: Payer: Self-pay | Admitting: Physician Assistant

## 2018-05-20 ENCOUNTER — Ambulatory Visit: Payer: 59 | Admitting: Physician Assistant

## 2018-05-20 VITALS — BP 126/72 | HR 73 | Temp 97.5°F | Ht 66.5 in | Wt 196.4 lb

## 2018-05-20 DIAGNOSIS — Z0000 Encounter for general adult medical examination without abnormal findings: Secondary | ICD-10-CM

## 2018-05-20 DIAGNOSIS — K2100 Gastro-esophageal reflux disease with esophagitis, without bleeding: Secondary | ICD-10-CM

## 2018-05-20 DIAGNOSIS — E785 Hyperlipidemia, unspecified: Secondary | ICD-10-CM

## 2018-05-20 DIAGNOSIS — D649 Anemia, unspecified: Secondary | ICD-10-CM

## 2018-05-20 DIAGNOSIS — G43909 Migraine, unspecified, not intractable, without status migrainosus: Secondary | ICD-10-CM

## 2018-05-20 DIAGNOSIS — I10 Essential (primary) hypertension: Secondary | ICD-10-CM

## 2018-05-20 DIAGNOSIS — F3342 Major depressive disorder, recurrent, in full remission: Secondary | ICD-10-CM

## 2018-05-20 DIAGNOSIS — E559 Vitamin D deficiency, unspecified: Secondary | ICD-10-CM

## 2018-05-20 DIAGNOSIS — E039 Hypothyroidism, unspecified: Secondary | ICD-10-CM | POA: Diagnosis not present

## 2018-05-20 DIAGNOSIS — J45909 Unspecified asthma, uncomplicated: Secondary | ICD-10-CM

## 2018-05-20 DIAGNOSIS — Z79899 Other long term (current) drug therapy: Secondary | ICD-10-CM

## 2018-05-20 MED ORDER — LEVOTHYROXINE SODIUM 125 MCG PO TABS: ORAL_TABLET | ORAL | 1 refills | Status: DC

## 2018-05-20 MED ORDER — ADVAIR DISKUS 100-50 MCG/DOSE IN AEPB: INHALATION_SPRAY | RESPIRATORY_TRACT | 2 refills | Status: DC

## 2018-05-20 NOTE — Patient Instructions (Signed)
    When it comes to diets, agreement about the perfect plan isn't easy to find, even among the experts. Experts at the [employer] developed an idea known as the Healthy Eating Plate. Just imagine a plate divided into logical, healthy portions.  The emphasis is on diet quality:  Load up on vegetables and fruits - one-half of your plate: Aim for color and variety, and remember that potatoes don't count.  Go for whole grains - one-quarter of your plate: Whole wheat, barley, wheat berries, quinoa, oats, brown rice, and foods made with them. If you want pasta, go with whole wheat pasta.  Protein power - one-quarter of your plate: Fish, chicken, beans, and nuts are all healthy, versatile protein sources. Limit red meat.  The diet, however, does go beyond the plate, offering a few other suggestions.  Use healthy plant oils, such as olive, canola, soy, corn, sunflower and peanut. Check the labels, and avoid partially hydrogenated oil, which have unhealthy trans fats.  If you're thirsty, drink water. Coffee and tea are good in moderation, but skip sugary drinks and limit milk and dairy products to one or two daily servings.  The type of carbohydrate in the diet is more important than the amount. Some sources of carbohydrates, such as vegetables, fruits, whole grains, and beans-are healthier than others.  Finally, stay active.  GENERAL HEALTH GOALS  Know what a healthy weight is for you (roughly BMI <25) and aim to maintain this  Aim for 7+ servings of fruits and vegetables daily  70-80+ fluid ounces of water or unsweet tea for healthy kidneys  Limit to max 1 drink of alcohol per day; avoid smoking/tobacco  Limit animal fats in diet for cholesterol and heart health - choose grass fed whenever available  Avoid highly processed foods, and foods high in saturated/trans fats  Aim for low stress - take time to unwind and care for your mental health  Aim for 150 min of  moderate intensity exercise weekly for heart health, and weights twice weekly for bone health  Aim for 7-9 hours of sleep daily

## 2018-05-20 NOTE — Progress Notes (Signed)
Future Appointments  Date Time Provider [place]  05/20/2018  9:00 AM Vicie Mutters, PA-C [facility] None  05/21/2019  9:00 AM Vicie Mutters, PA-C [facility] None

## 2018-05-21 LAB — COMPLETE METABOLIC PANEL WITH GFR
AG RATIO: 2.3 (calc) (ref 1.0–2.5)
ALT: 18 U/L (ref 6–29)
AST: 17 U/L (ref 10–35)
Albumin: 4.1 g/dL (ref 3.6–5.1)
Alkaline phosphatase (APISO): 51 U/L (ref 33–130)
BILIRUBIN TOTAL: 0.4 mg/dL (ref 0.2–1.2)
BUN: 16 mg/dL (ref 7–25)
CO2: 30 mmol/L (ref 20–32)
Calcium: 9.3 mg/dL (ref 8.6–10.4)
Chloride: 106 mmol/L (ref 98–110)
Creat: 0.77 mg/dL (ref 0.50–1.05)
GFR, EST AFRICAN AMERICAN: 100 mL/min/{1.73_m2} (ref >=60)
GFR, Est Non African American: 86 mL/min/{1.73_m2} (ref >=60)
Globulin: 1.8 g/dL (calc) — ABNORMAL LOW (ref 1.9–3.7)
Glucose, Bld: 87 mg/dL (ref 65–99)
POTASSIUM: 4.2 mmol/L (ref 3.5–5.3)
SODIUM: 144 mmol/L (ref 135–146)
TOTAL PROTEIN: 5.9 g/dL — AB (ref 6.1–8.1)

## 2018-05-21 LAB — LIPID PANEL
Cholesterol: 236 mg/dL — ABNORMAL HIGH (ref <200)
HDL: 80 mg/dL (ref >50)
LDL Cholesterol (Calc): 134 mg/dL (calc) — ABNORMAL HIGH
NON-HDL CHOLESTEROL (CALC): 156 mg/dL — AB (ref <130)
Total CHOL/HDL Ratio: 3 (calc) (ref <5.0)
Triglycerides: 108 mg/dL (ref <150)

## 2018-05-21 LAB — IRON, TOTAL/TOTAL IRON BINDING CAP
%SAT: 42 % (calc) (ref 16–45)
Iron: 143 ug/dL (ref 45–160)
TIBC: 337 mcg/dL (calc) (ref 250–450)

## 2018-05-21 LAB — MICROALBUMIN / CREATININE URINE RATIO
CREATININE, URINE: 21 mg/dL (ref 20–275)
Microalb, Ur: <0.2 mg/dL

## 2018-05-21 LAB — CBC WITH DIFFERENTIAL/PLATELET
Absolute Monocytes: 319 cells/uL (ref 200–950)
BASOS ABS: 78 {cells}/uL (ref 0–200)
Basophils Relative: 1.6 %
EOS PCT: 3.8 %
Eosinophils Absolute: 186 cells/uL (ref 15–500)
HCT: 42.1 % (ref 35.0–45.0)
Hemoglobin: 14.5 g/dL (ref 11.7–15.5)
Lymphs Abs: 1529 cells/uL (ref 850–3900)
MCH: 31.7 pg (ref 27.0–33.0)
MCHC: 34.4 g/dL (ref 32.0–36.0)
MCV: 92.1 fL (ref 80.0–100.0)
MONOS PCT: 6.5 %
MPV: 10.1 fL (ref 7.5–12.5)
NEUTROS ABS: 2788 {cells}/uL (ref 1500–7800)
NEUTROS PCT: 56.9 %
Platelets: 305 10*3/uL (ref 140–400)
RBC: 4.57 10*6/uL (ref 3.80–5.10)
RDW: 12.4 % (ref 11.0–15.0)
Total Lymphocyte: 31.2 %
WBC: 4.9 10*3/uL (ref 3.8–10.8)

## 2018-05-21 LAB — URINALYSIS, ROUTINE W REFLEX MICROSCOPIC
BILIRUBIN URINE: NEGATIVE
GLUCOSE, UA: NEGATIVE
Hgb urine dipstick: NEGATIVE
Ketones, ur: NEGATIVE
Leukocytes, UA: NEGATIVE
Nitrite: NEGATIVE
PROTEIN: NEGATIVE
Specific Gravity, Urine: 1.006 (ref 1.001–1.03)
pH: 7.5 (ref 5.0–8.0)

## 2018-05-21 LAB — VITAMIN D 25 HYDROXY (VIT D DEFICIENCY, FRACTURES): VIT D 25 HYDROXY: 36 ng/mL (ref 30–100)

## 2018-05-21 LAB — VITAMIN B12: Vitamin B-12: 432 pg/mL (ref 200–1100)

## 2018-05-21 LAB — FERRITIN: Ferritin: 48 ng/mL (ref 16–232)

## 2018-05-21 LAB — TSH: TSH: 12.6 mIU/L — ABNORMAL HIGH (ref 0.40–4.50)

## 2018-05-21 LAB — MAGNESIUM: MAGNESIUM: 2.3 mg/dL (ref 1.5–2.5)

## 2018-05-23 NOTE — Addendum Note (Signed)
Addended by: Vicie Mutters R on: 05/23/2018 01:31 PM   Modules accepted: Orders

## 2018-05-29 ENCOUNTER — Telehealth: Payer: Self-pay | Admitting: Physician Assistant

## 2018-05-29 NOTE — Telephone Encounter (Signed)
I contacted this patient to schedule a 1 month lab only to re-check her thyroid due to dosing changes (per Estill Bamberg). Patient asked why she needed to come in again in 1 month and I explained that we will need to see how the changes in medication dosing effected her thyroid level. I gave her the dosing instructions again (although she told me that she had already seen them via mychart) and she told me that she did not feel comfortable taking that much thyroid medication. I explained to the patient that it was Amanda's recommendation to do so. She denied scheduling a 1 month lab only at this time and states that she will reach out to [place] through [place] and speak with her about this before she schedules.  -Osvaldo Human

## 2018-05-29 NOTE — Telephone Encounter (Signed)
-----   Message from Vicie Mutters, [place] sent at 05/23/2018  1:31 PM EST ----- The test results show that your current treatment is OK.  There is no other need for change of treatment or further evaluation based on these results.  Please continue the same plan as discussed at your office visit.  Test results slightly outside the reference range are not unusual. It there is anything important, I will review this with you, otherwise it is considered normal test values. If you have further questions, please do not hesitate to contact me at the office or via My Chart.   Thyroid level is not in range. Please make sure you are taking your thyroid medication 60 mins before food with just water. Do not take it within 4 hours of calcium, magnesium, stomach medications like tums, zantac, prilosec for example. Recheck lab only in one month.   Take the 130mcg daily BUT do 1.5 pills on Wednesday and Friday- recheck 1 month lab only  Hypothyroidism can cause slow metabolism, fatigue, weight gain, dry hair/skin, constipation, swelling, decreased memory/brain fog.  Your LDL is not in range or at goal.  Your LDL is the bad cholesterol that can lead to heart attack and stroke. To lower your number you can decrease your fatty foods, red meat, cheese, milk and increase fiber like whole grains and veggies. You can also add a fiber supplement like Citracel or Benefiber, these do not cause gas and bloating and are safe to use.

## 2018-06-20 ENCOUNTER — Telehealth: Payer: Self-pay

## 2018-06-20 NOTE — Telephone Encounter (Signed)
DUE TO PATIENT'S PHONE  UNABLE TO LVM

## 2018-06-20 NOTE — Telephone Encounter (Signed)
-----   Message from Vicie Mutters, [place] sent at 06/20/2018 12:43 PM EST ----- Regarding: RE: question Should be fine for her to give plasma.  Estill Bamberg ----- Message ----- From: Elenor Quinones, CMA Sent: 06/19/2018   3:31 PM EST To: Vicie Mutters, PA-C Subject: question                                       Is it safe for her to give plasma?  Please advise

## 2018-09-27 ENCOUNTER — Other Ambulatory Visit: Payer: Self-pay | Admitting: Physician Assistant

## 2018-12-14 ENCOUNTER — Other Ambulatory Visit: Payer: Self-pay | Admitting: Physician Assistant

## 2019-01-06 ENCOUNTER — Encounter: Payer: Self-pay | Admitting: Gastroenterology

## 2019-01-22 ENCOUNTER — Telehealth: Payer: Self-pay

## 2019-01-22 NOTE — Telephone Encounter (Signed)
Message sent to front office in order to schedule a visit for a work note to not wear a mask & wear a face shield.

## 2019-01-22 NOTE — Telephone Encounter (Signed)
-----   Message from Vicie Mutters, [place] sent at 01/22/2019  8:10 AM EDT ----- Regarding: RE: office note Contact: [phone] She had not been seen since Jan.  Also we are not writing notes often for people to not wear a mask so we would need to evaluate her.  Estill Bamberg ----- Message ----- From: Elenor Quinones, CMA Sent: 01/21/2019  12:13 PM EDT To: Vicie Mutters, PA-C Subject: office note                                    Says someone called her yesterday about the note she requested to have a face shield at work instead of a mask due to asthma.....saying that she would need an appt first she wants to know why she needs an appt for a note?

## 2019-01-30 NOTE — Progress Notes (Signed)
Assessment and Plan:  Persis was seen today for face shield request.  Diagnoses and all orders for this visit:  Mild persistent asthma without complication Well controlled with current inhalers Concern with mask contributing to dyspnea at work [place] discussion, reviewed CDC guidelines Strongly encouraged her to wear mask per guidelines Always wear mask when in closed room with coworkers/at work station However as limitations only with exertion which is away from other coworkers, reasonable for her to wear a face shield and uncover nose when climbing stairs or with exertion. She should still maintain 6 ft distance, replace mask if approaching coworker or returning to workstation and perform hand hygiene after touching mask.  Note reflecting this was written for her to present at work.  Encouraged to follow up if further questions or concerns  Need for influenza vaccination  - quadrivalent administered without complication today   Further disposition pending results of labs. Discussed med's effects and SE's.   Over 15 minutes of exam, counseling, chart review, and critical decision making was performed.   Future Appointments  Date Time Provider [place]  05/21/2019  9:00 AM Vicie Mutters, PA-C [facility] None    ------------------------------------------------------------------------------------------------------------------   HPI BP 132/82   Pulse 77   Temp (!) 96.8 F (36 C)   Wt 204 lb (92.5 kg)   SpO2 98%   BMI 32.43 kg/m   58 y.o.female with hx of asthma presents for evaluation requesting note for permission to wear face shield as alternative to face masks.  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   She has history of asthma, former smoker  She is currently taking advair BID, hasn't needed a rescue inhaler, has been well controlled.     Past Medical History:  Diagnosis Date  . Anemia    iron  . Asthma   . Depression   . GERD (gastroesophageal reflux disease)   . Hyperlipidemia   . Hypertension   . Migraine   . 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 Unspecified hypothyroidism 03/06/2013   Secondary to thyroid cancer and RAI treatment  . Vitamin D deficiency      No Known Allergies  Current Outpatient Medications on File Prior to Visit  Medication Sig  . ADVAIR DISKUS 100-50 MCG/DOSE AEPB Use 1 inhalation 2 x /day - every 12 hours  . Ascorbic Acid (VITAMIN C) 100 MG CHEW Chew by mouth daily.  Marland Kitchen aspirin 81 MG tablet Take 81 mg by mouth daily.  Marland Kitchen b complex vitamins tablet Take 1 tablet by mouth daily.  . Calcium Carbonate-Vitamin D (CALCIUM + D PO) Take by mouth.  . cholecalciferol (VITAMIN D) 1000 UNITS tablet Take 1,000 Units by mouth 2 (two) times daily.  . Coenzyme Q10 (CO Q 10 PO) Take 200 mg by mouth.  . ferrous sulfate 325 (65 FE) MG tablet Take 325 mg by mouth daily with breakfast.  . levothyroxine (SYNTHROID) 125 MCG tablet Take 1 tablet daily on an empty stomach with only water for 30 minutes & no Antacid meds,  Calcium or Magnesium for 4 hours & avoid Biotin  . Magnesium 500 MG TABS Take by mouth daily.  . Multiple Vitamin (MULTIVITAMIN) tablet Take 1 tablet by mouth daily.  . TURMERIC PO Take by mouth daily.  . Zinc 50 MG TABS Take by mouth daily.  . Glucosamine-Chondroitin (OSTEO BI-FLEX REGULAR STRENGTH PO) Take by mouth daily.  Javier Docker Oil 1000 MG CAPS Take by mouth daily.  . Nutritional Supplements (OSTEO ADVANCE PO) Take by mouth.   No current facility-administered medications on file prior to  visit.     ROS: all negative except above.   Physical Exam:  BP 132/82   Pulse 77   Temp (!) 96.8 F (36 C)   Wt 204 lb (92.5 kg)   SpO2 98%   BMI 32.43 kg/m   General Appearance: Well nourished, in no apparent distress. Eyes: PERRLA, EOMs, conjunctiva no swelling or erythema Sinuses: No Frontal/maxillary tenderness ENT/Mouth: Ext aud canals clear, TMs without erythema, bulging. No erythema, swelling, or exudate on post pharynx.  Tonsils not swollen or erythematous. Hearing normal.  Neck: Supple, thyroid normal.  Respiratory: Respiratory effort normal, BS equal bilaterally without rales, rhonchi, wheezing or stridor.  Cardio: RRR with no MRGs. Brisk peripheral pulses without edema.  Abdomen: Soft, + BS.  Non tender, no guarding, rebound, hernias, masses. Lymphatics: Non tender without lymphadenopathy.  Musculoskeletal: no obvious deformity, normal gait.  Skin: Warm, dry without rashes, lesions, ecchymosis.  Neuro: Normal muscle tone, no cerebellar symptoms. Sensation intact.  Psych: Awake and oriented X 3, normal affect, Insight and Judgment appropriate.     Izora Ribas, NP 11:47 AM [facility].m

## 2019-02-03 ENCOUNTER — Ambulatory Visit: Payer: 59 | Admitting: Adult Health

## 2019-02-03 ENCOUNTER — Encounter: Payer: Self-pay | Admitting: Adult Health

## 2019-02-03 ENCOUNTER — Other Ambulatory Visit: Payer: Self-pay

## 2019-02-03 VITALS — BP 132/82 | HR 77 | Temp 96.8°F | Wt 204.0 lb

## 2019-02-03 DIAGNOSIS — Z23 Encounter for immunization: Secondary | ICD-10-CM

## 2019-02-03 DIAGNOSIS — J4530 Mild persistent asthma, uncomplicated: Secondary | ICD-10-CM

## 2019-02-03 NOTE — Patient Instructions (Signed)
  Dr. Fuller Plan phone number  [phone]

## 2019-05-05 ENCOUNTER — Other Ambulatory Visit: Payer: Self-pay | Admitting: Internal Medicine

## 2019-05-05 DIAGNOSIS — Z1231 Encounter for screening mammogram for malignant neoplasm of breast: Secondary | ICD-10-CM

## 2019-05-21 ENCOUNTER — Encounter: Payer: Self-pay | Admitting: Physician Assistant

## 2019-05-29 ENCOUNTER — Encounter: Payer: Self-pay | Admitting: Adult Health

## 2019-05-29 DIAGNOSIS — Z86010 Personal history of colonic polyps: Secondary | ICD-10-CM

## 2019-05-29 DIAGNOSIS — E669 Obesity, unspecified: Secondary | ICD-10-CM | POA: Insufficient documentation

## 2019-05-29 DIAGNOSIS — Z860101 Personal history of adenomatous and serrated colon polyps: Secondary | ICD-10-CM | POA: Insufficient documentation

## 2019-05-29 HISTORY — DX: Personal history of adenomatous and serrated colon polyps: Z86.0101

## 2019-05-29 HISTORY — DX: Personal history of colonic polyps: Z86.010

## 2019-05-29 NOTE — Progress Notes (Deleted)
Complete Physical  Assessment and Plan:  Encounter for general adult medical examination with abnormal findings 1 year  Essential hypertension - continue medications, DASH diet, exercise and monitor at home. Call if greater than 130/80.  -     CBC with Differential/Platelet -     CMP/GFR -     TSH -     Urinalysis, Routine w reflex microscopic -     Microalbumin / creatinine urine ratio -     EKG  Hypothyroidism, unspecified type Hypothyroidism-check TSH level, continue medications the same, reminded to take on an empty stomach 30-56mins before food.  -     TSH  Hyperlipidemia, unspecified hyperlipidemia type -continue medications, check lipids, decrease fatty foods, increase activity.  -     Lipid panel  Migraine without status migrainosus, not intractable, unspecified migraine type Avoid triggers  Uncomplicated asthma, unspecified asthma severity, unspecified whether persistent Controlled Refill Advair ***  Gastroesophageal reflux disease with esophagitis Continue PPI/H2 blocker, diet discussed  Anemia, unspecified type -     CBC with Differential/Platelet  Recurrent major depressive disorder, in full remission ([place]) - continue medications, stress management techniques discussed, increase water, good sleep hygiene discussed, increase exercise, and increase veggies.   Vitamin D deficiency -     VITAMIN D 25 Hydroxy (Vit-D Deficiency, Fractures)  Medication management -     Magnesium   Discussed med's effects and SE's. Screening labs and tests as requested with regular follow-up as recommended. Over 40 minutes of exam, counseling, chart review, and complex, high level critical decision making was performed this visit.   HPI  59 y.o. female  presents for a complete physical and follow up for has Hypertension; Hyperlipidemia; GERD (gastroesophageal reflux disease); Asthma; Anemia; Migraine; Depression; Vitamin D deficiency; Hypothyroidism; and Obesity (BMI 30.0-34.9)  on their problem list.    Migraines ***  Mild persistent asthma, advair ***  BMI is There is no height or weight on file to calculate BMI., she {HAS HAS [id number] been working on diet and exercise. She got a new job [employer].  Wt Readings from Last 3 Encounters:  02/03/19 204 lb (92.5 kg)  05/20/18 196 lb 6.4 oz (89.1 kg)  02/06/18 192 lb 6.4 oz (87.3 kg)   Her blood pressure has been controlled at home, today their BP is   She does workout. She denies chest pain, shortness of breath, dizziness.   She is not on cholesterol medication and denies myalgias. Her cholesterol is at goal. The cholesterol last visit was:   Lab Results  Component Value Date   CHOL 236 (H) 05/20/2018   HDL 80 05/20/2018   LDLCALC 134 (H) 05/20/2018   TRIG 108 05/20/2018   CHOLHDL 3.0 05/20/2018    Last A1C in the office was:  Lab Results  Component Value Date   HGBA1C 5.5 05/03/2016   Patient is on Vitamin D supplement.   Lab Results  Component Value Date   VD25OH 36 05/20/2018     She is on thyroid medication for history of thyroid cancer s/p thyroidectomy in 2012, was following with Dr. Chalmers Cater but was released. Her medication was changed last visit due to palpitations, 125 mcg *** daily and she states she is doing better.  Lab Results  Component Value Date   TSH 12.60 (H) 05/20/2018    She has hx of iron deficiency deficiency *** Lab Results  Component Value Date   WBC 4.9 05/20/2018   HGB 14.5 05/20/2018   HCT 42.1 05/20/2018  MCV 92.1 05/20/2018   PLT 305 05/20/2018   Lab Results  Component Value Date   IRON 143 05/20/2018   TIBC 337 05/20/2018   FERRITIN 48 05/20/2018   Lab Results  Component Value Date   [id number] 05/20/2018     Current Medications:  Current Outpatient Medications on File Prior to Visit  Medication Sig Dispense Refill  . ADVAIR DISKUS 100-50 MCG/DOSE AEPB Use 1 inhalation 2 x /day - every 12 hours 180 each 3  . Ascorbic Acid (VITAMIN C) 100 MG  CHEW Chew by mouth daily.    Marland Kitchen aspirin 81 MG tablet Take 81 mg by mouth daily.    Marland Kitchen b complex vitamins tablet Take 1 tablet by mouth daily.    . Calcium Carbonate-Vitamin D (CALCIUM + D PO) Take by mouth.    . cholecalciferol (VITAMIN D) 1000 UNITS tablet Take 1,000 Units by mouth 2 (two) times daily.    . Coenzyme Q10 (CO Q 10 PO) Take 200 mg by mouth.    . ferrous sulfate 325 (65 FE) MG tablet Take 325 mg by mouth daily with breakfast.    . Glucosamine-Chondroitin (OSTEO BI-FLEX REGULAR STRENGTH PO) Take by mouth daily.    Javier Docker Oil 1000 MG CAPS Take by mouth daily.    Marland Kitchen levothyroxine (SYNTHROID) 125 MCG tablet Take 1 tablet daily on an empty stomach with only water for 30 minutes & no Antacid meds, Calcium or Magnesium for 4 hours & avoid Biotin 90 tablet 3  . Magnesium 500 MG TABS Take by mouth daily.    . Multiple Vitamin (MULTIVITAMIN) tablet Take 1 tablet by mouth daily.    . Nutritional Supplements (OSTEO ADVANCE PO) Take by mouth.    . TURMERIC PO Take by mouth daily.    . Zinc 50 MG TABS Take by mouth daily.     No current facility-administered medications on file prior to visit.   Allergies:  No Known Allergies   Medical History:  She has Hypertension; Hyperlipidemia; GERD (gastroesophageal reflux disease); Asthma; Anemia; Migraine; Depression; Vitamin D deficiency; Hypothyroidism; and Obesity (BMI 30.0-34.9) on their problem list.   Health Maintenance:   Immunization History  Administered Date(s) Administered  . Influenza Inj Mdck Quad With Preservative 03/25/2017, 02/06/2018, 02/03/2019  . Influenza,inj,quad, With Preservative 02/09/2016  . Influenza-Unspecified 02/10/2012, 01/29/2014  . PPD Test 03/23/2014  . Pneumococcal-Unspecified 03/05/2008  . Tdap 02/17/2007, 05/03/2016   Tetanus: 2018 Pneumovax: 2009 Prevnar due 66 Flu vaccine: 01/2019 Zostavax:   No LMP recorded. (Menstrual status: Perimenopausal). Pap: 2018 never abnormal pap neg HPV will repeat 5  years MGM: 05/21/2017 normal, due ***, had normal [place] left breast Colonoscopy: 01/2014 Dr. Fuller Plan adenomas- *** RAI 2012 Whole body scan 05/2015  Last Dental Exam:  Dr. Randol Kern  Last Eye Exam:  Dr. Einar Gip   Patient Care Team: Unk Pinto, MD as PCP - General (Internal Medicine) Webb Laws, [place] as Referring Physician (Optometry) Ladene Artist, MD as Consulting Physician (Gastroenterology)  Surgical History:  She has a past surgical history that includes Appendectomy; Total thyroidectomy (06/2010); and Mandible surgery. Family History:  Herfamily history includes Cancer in her maternal aunt; Diabetes in her paternal grandmother; Stroke in her paternal grandfather. Social History:  She reports that she quit smoking about 32 years ago. Her smoking use included cigarettes. She has a 5.00 pack-year smoking history. She has never used smokeless tobacco. She reports that she does not drink alcohol or use drugs.  Review of Systems: Review of  Systems  Constitutional: Negative.   HENT: Negative.   Eyes: Negative.   Respiratory: Negative.   Cardiovascular: Negative.   Gastrointestinal: Negative.   Genitourinary: Negative.   Musculoskeletal: Negative.   Skin: Negative.   Neurological: Negative.   Endo/Heme/Allergies: Negative.   Psychiatric/Behavioral: Negative.     Physical Exam: Estimated body mass index is 32.43 kg/m as calculated from the following:   Height as of 05/20/18: 5' 6.5" (1.689 m).   Weight as of 02/03/19: 204 lb (92.5 kg). There were no vitals taken for this visit. General Appearance: Well nourished, in no apparent distress.  Eyes: PERRLA, EOMs, conjunctiva no swelling or erythema, normal fundi and vessels.  Sinuses: No Frontal/maxillary tenderness  ENT/Mouth: Ext aud canals clear, normal light reflex with TMs without erythema, bulging. Good dentition. No erythema, swelling, or exudate on post pharynx. Tonsils not swollen or erythematous. Hearing normal.   Neck: Supple, thyroid normal. No bruits  Respiratory: Respiratory effort normal, BS equal bilaterally without rales, rhonchi, wheezing or stridor.  Cardio: RRR with 1/6 systolic murmur ***, best hear on RSB, without rubs or gallops. Brisk peripheral pulses without edema.  Chest: symmetric, with normal excursions and percussion.  Breasts: Symmetric, without lumps, nipple discharge, retractions.  Abdomen: Soft, nontender, no guarding, rebound, hernias, masses, or organomegaly.  Lymphatics: Non tender without lymphadenopathy.  Genitourinary: defer Musculoskeletal: Full ROM all peripheral extremities,5/5 strength, and normal gait.  Skin: Warm, dry without rashes, lesions, ecchymosis. Neuro: Cranial nerves intact, reflexes equal bilaterally. Normal muscle tone, no cerebellar symptoms. Sensation intact.  Psych: Awake and oriented X 3, normal affect, Insight and Judgment appropriate.   EKG: *** AORTA SCAN: defer  Gorden Harms Ava Deguire 1:08 PM [facility]

## 2019-06-09 ENCOUNTER — Encounter: Payer: Self-pay | Admitting: Adult Health

## 2019-06-13 NOTE — Progress Notes (Signed)
Complete Physical  Assessment and Plan:  Encounter for general adult medical examination with abnormal findings 1 year  Essential hypertension - continue medications, DASH diet, exercise and monitor at home. Call if greater than 130/80.  -     CBC with Differential/Platelet -     CMP/GFR -     TSH -     Urinalysis, Routine w reflex microscopic -     Microalbumin / creatinine urine ratio -     EKG  Hypothyroidism, unspecified type Hypothyroidism-check TSH level, continue medications the same, reminded to take on an empty stomach 30-52mins before food. STOP biotin  -     TSH  Hyperlipidemia, unspecified hyperlipidemia type -continue medications, check lipids, decrease fatty foods, increase activity.  - LDL goal <100; discussed medication if 130+ - she is on new weight loss/lifestyle program, would like to pursue for 6 months and defer medications - follow up 6 months  -     Lipid panel  Migraine without status migrainosus, not intractable, unspecified migraine type None in years; resolved to history   Uncomplicated asthma, unspecified asthma severity, unspecified whether persistent Controlled Refill Advair, albuterol PRN, try 1 puff 15 min prior to exercise  Gastroesophageal reflux disease with esophagitis Recently well controlled by lifestyle, PRN tums; monitor   Anemia, unspecified type -     CBC with Differential/Platelet -     Iron, TIBC, ferritin - if normal stop supplement -     B12  Recurrent major depressive disorder, in full remission ([place]) - continue medications, stress management techniques discussed, increase water, good sleep hygiene discussed, increase exercise, and increase veggies.   Vitamin D deficiency -     VITAMIN D 25 Hydroxy (Vit-D Deficiency, Fractures)  Medication management -     Magnesium  Orders Placed This Encounter  Procedures  . CBC with Differential/Platelet  . COMPLETE METABOLIC PANEL WITH GFR  . Magnesium  . Lipid panel  . TSH  .  Hemoglobin A1c  . VITAMIN D 25 Hydroxy (Vit-D Deficiency, Fractures)  . Microalbumin / creatinine urine ratio  . Urinalysis, Routine w reflex microscopic  . Vitamin B12  . Iron, TIBC and Ferritin Panel  . EKG 12-Lead      Discussed med's effects and SE's. Screening labs and tests as requested with regular follow-up as recommended. Over 40 minutes of exam, counseling, chart review, and complex, high level critical decision making was performed this visit.   Future Appointments  Date Time Provider [place]  06/15/2020  2:00 PM Liane Comber, NP [facility] None     HPI  59 y.o. female  presents for a complete physical and follow up for has Hypertension; Hyperlipidemia; GERD (gastroesophageal reflux disease); Asthma; Anemia; Depression; Vitamin D deficiency; Hypothyroidism; Obesity (BMI 30.0-34.9); and History of adenomatous polyp of colon on their problem list.  She is married, 2 daughters, 3 grandsons, close by and sees frequently.  She works at [employer], retired from Museum/gallery curator.   Migraines - reports last remote, improved after menopausea, can't recall last. Occasional mild headache resolves quickly with advil.   Mild persistent asthma, advair BID and reports does very well with this. No recent flares.   GERD well controlled with lifestyle modification, rare tums.   She has hx of major depression, in remission off of medications. PHQ-9 of 0 today. Reports sleeping well.   BMI is Body mass index is 32.51 kg/m., she has been working on diet and exercise. She is doing Web designer, Recruitment consultant  min, once a week, 20 min daily, counting steps for goal of [id number].  Working with husband as well  Using lose it! App to track  Fasting 1 day/week on Mondays Water; 70-80 fluid ounces No alcohol, 12-14 ounces of coffee daily, rare soda, switching to green tea Sleeps well  Trying to get down to <180 lb  Wt Readings from Last 3 Encounters:  06/16/19 201 lb 6.4 oz (91.4  kg)  02/03/19 204 lb (92.5 kg)  05/20/18 196 lb 6.4 oz (89.1 kg)   Her blood pressure has been controlled at home, today their BP is BP: 130/78 She does workout. She denies chest pain, shortness of breath, dizziness.   She is not on cholesterol medication and denies myalgias. Her cholesterol is not at goal, working on diet this year and prefers to avoid adding medication if possible. The cholesterol last visit was:   Lab Results  Component Value Date   CHOL 236 (H) 05/20/2018   HDL 80 05/20/2018   LDLCALC 134 (H) 05/20/2018   TRIG 108 05/20/2018   CHOLHDL 3.0 05/20/2018    Last A1C in the office was:  Lab Results  Component Value Date   HGBA1C 5.5 05/03/2016   Patient is on Vitamin D supplement.   Lab Results  Component Value Date   VD25OH 36 05/20/2018     She is on thyroid medication for history of thyroid cancer s/p thyroidectomy in 2012, was following with Dr. Chalmers Cater but was released. Her medication was changed last visit due to palpitations, 125 mcg daily, takes first thing with water, nothing else for 1 hour. She is on a biotin supplement.  Lab Results  Component Value Date   TSH 12.60 (H) 05/20/2018      Current Medications:  Current Outpatient Medications on File Prior to Visit  Medication Sig Dispense Refill  . ADVAIR DISKUS 100-50 MCG/DOSE AEPB Use 1 inhalation 2 x /day - every 12 hours 180 each 3  . Ascorbic Acid (VITAMIN C) 100 MG CHEW Chew by mouth daily.    Marland Kitchen aspirin 81 MG tablet Take 81 mg by mouth daily.    Marland Kitchen b complex vitamins tablet Take 1 tablet by mouth daily.    . Calcium Carbonate-Vitamin D (CALCIUM + D PO) Take by mouth.    . cholecalciferol (VITAMIN D) 1000 UNITS tablet Take 1,000 Units by mouth 2 (two) times daily. Takes 10,000 units daily    . Coenzyme Q10 (CO Q 10 PO) Take 200 mg by mouth.    . ferrous sulfate 325 (65 FE) MG tablet Take 325 mg by mouth daily with breakfast.    . levothyroxine (SYNTHROID) 125 MCG tablet Take 1 tablet daily on an  empty stomach with only water for 30 minutes & no Antacid meds, Calcium or Magnesium for 4 hours & avoid Biotin 90 tablet 3  . Magnesium 500 MG TABS Take by mouth daily.    . Multiple Vitamin (MULTIVITAMIN) tablet Take 1 tablet by mouth daily.    Marland Kitchen OVER THE COUNTER MEDICATION Takes Neuriva daily.    . TURMERIC PO Take by mouth daily.    . Zinc 50 MG TABS Take by mouth daily.    . Nutritional Supplements (OSTEO ADVANCE PO) Take by mouth.     No current facility-administered medications on file prior to visit.   Allergies:  No Known Allergies   Medical History:  She has Hypertension; Hyperlipidemia; GERD (gastroesophageal reflux disease); Asthma; Anemia; Depression; Vitamin D deficiency; Hypothyroidism; Obesity (BMI 30.0-34.9);  and History of adenomatous polyp of colon on their problem list.   Health Maintenance:   Immunization History  Administered Date(s) Administered  . Influenza Inj Mdck Quad With Preservative 03/25/2017, 02/06/2018, 02/03/2019  . Influenza,inj,quad, With Preservative 02/09/2016  . Influenza-Unspecified 02/10/2012, 01/29/2014  . PPD Test 03/23/2014  . Pneumococcal-Unspecified 03/05/2008  . Tdap 02/17/2007, 05/03/2016   Tetanus: 2018 Pneumovax: 2009 Prevnar due 66 Flu vaccine: 01/2019 Zostavax:   No LMP recorded. (Menstrual status: Perimenopausal). Pap: 2018 never abnormal pap neg HPV will repeat 5 years ,due 2023 MGM: 06/15/2018 today, had normal [place] left breast Colonoscopy: 01/2014 Dr. Fuller Plan adenomas- per patient got letter, not due until 2022 RAI 2012 Whole body scan 05/2015  Last Dental Exam:  Dr. Randol Kern, last 2021, goes q59m Last Eye Exam:  Dr. Einar Gip, last 2020  Patient Care Team: Unk Pinto, MD as PCP - General (Internal Medicine) Webb Laws, [place] as Referring Physician (Optometry) Ladene Artist, MD as Consulting Physician (Gastroenterology)  Surgical History:  She has a past surgical history that includes Appendectomy; Total  thyroidectomy (06/2010); and Mandible surgery. Family History:  Herfamily history includes Cancer in her maternal aunt; Diabetes in her paternal grandmother; Stroke in her paternal grandfather. Social History:  She reports that she quit smoking about 32 years ago. Her smoking use included cigarettes. She has a 5.00 pack-year smoking history. She has never used smokeless tobacco. She reports that she does not drink alcohol or use drugs.  Review of Systems: Review of Systems  Constitutional: Negative.   HENT: Negative.   Eyes: Negative.   Respiratory: Negative.   Cardiovascular: Negative.   Gastrointestinal: Negative.   Genitourinary: Negative.   Musculoskeletal: Negative.   Skin: Negative.   Neurological: Negative.   Endo/Heme/Allergies: Negative.   Psychiatric/Behavioral: Negative.     Physical Exam: Estimated body mass index is 32.51 kg/m as calculated from the following:   Height as of this encounter: 5\' 6"  (1.676 m).   Weight as of this encounter: 201 lb 6.4 oz (91.4 kg). BP 130/78   Pulse 66   Temp 97.7 F (36.5 C)   Ht 5\' 6"  (1.676 m)   Wt 201 lb 6.4 oz (91.4 kg)   SpO2 99%   BMI 32.51 kg/m  General Appearance: Well nourished, in no apparent distress.  Eyes: PERRLA, EOMs, conjunctiva no swelling or erythema Sinuses: No Frontal/maxillary tenderness  ENT/Mouth: Ext aud canals clear, normal light reflex with TMs without erythema, bulging. Good dentition. No erythema, swelling, or exudate on post pharynx. Tonsils not swollen or erythematous. Hearing normal.  Neck: Supple, well healed surgical scar at base of neck, no lumps or nodules. No bruits  Respiratory: Respiratory effort normal, BS equal bilaterally without rales, rhonchi, wheezing or stridor.  Cardio: RRR without murmurs, without rubs or gallops. Brisk peripheral pulses without edema.  Chest: symmetric, with normal excursions and percussion.  Breasts: Defer per shared decision making  Abdomen: Soft, nontender, no  guarding, rebound, hernias, masses, or organomegaly.  Lymphatics: Non tender without lymphadenopathy.  Genitourinary: defer Musculoskeletal: Full ROM all peripheral extremities,5/5 strength, and normal gait.  Skin: Warm, dry without rashes, lesions, ecchymosis. Neuro: Cranial nerves intact, reflexes equal bilaterally. Normal muscle tone, no cerebellar symptoms. Sensation intact.  Psych: Awake and oriented X 3, normal affect, Insight and Judgment appropriate.   EKG: sinus brady, RSR' V1 V2, IRBBB, NSCPT  Gorden Harms Lyndzee Kliebert 2:19 PM [place] [facility]

## 2019-06-16 ENCOUNTER — Other Ambulatory Visit: Payer: Self-pay

## 2019-06-16 ENCOUNTER — Ambulatory Visit: Payer: 59 | Admitting: Adult Health

## 2019-06-16 ENCOUNTER — Encounter: Payer: Self-pay | Admitting: Adult Health

## 2019-06-16 ENCOUNTER — Ambulatory Visit
Admission: RE | Admit: 2019-06-16 | Discharge: 2019-06-16 | Disposition: A | Discharge status: Home / Self Care | Payer: 59 | Source: Ambulatory Visit | Attending: Internal Medicine | Admitting: Internal Medicine

## 2019-06-16 VITALS — BP 130/78 | HR 66 | Temp 97.7°F | Ht 66.0 in | Wt 201.4 lb

## 2019-06-16 DIAGNOSIS — E785 Hyperlipidemia, unspecified: Secondary | ICD-10-CM

## 2019-06-16 DIAGNOSIS — K2100 Gastro-esophageal reflux disease with esophagitis, without bleeding: Secondary | ICD-10-CM

## 2019-06-16 DIAGNOSIS — Z86010 Personal history of colonic polyps: Secondary | ICD-10-CM

## 2019-06-16 DIAGNOSIS — E559 Vitamin D deficiency, unspecified: Secondary | ICD-10-CM

## 2019-06-16 DIAGNOSIS — E669 Obesity, unspecified: Secondary | ICD-10-CM

## 2019-06-16 DIAGNOSIS — Z136 Encounter for screening for cardiovascular disorders: Secondary | ICD-10-CM

## 2019-06-16 DIAGNOSIS — D649 Anemia, unspecified: Secondary | ICD-10-CM

## 2019-06-16 DIAGNOSIS — D509 Iron deficiency anemia, unspecified: Secondary | ICD-10-CM

## 2019-06-16 DIAGNOSIS — J4530 Mild persistent asthma, uncomplicated: Secondary | ICD-10-CM

## 2019-06-16 DIAGNOSIS — Z0000 Encounter for general adult medical examination without abnormal findings: Secondary | ICD-10-CM

## 2019-06-16 DIAGNOSIS — Z1231 Encounter for screening mammogram for malignant neoplasm of breast: Secondary | ICD-10-CM

## 2019-06-16 DIAGNOSIS — I10 Essential (primary) hypertension: Secondary | ICD-10-CM

## 2019-06-16 DIAGNOSIS — F3342 Major depressive disorder, recurrent, in full remission: Secondary | ICD-10-CM

## 2019-06-16 DIAGNOSIS — Z131 Encounter for screening for diabetes mellitus: Secondary | ICD-10-CM

## 2019-06-16 DIAGNOSIS — G43909 Migraine, unspecified, not intractable, without status migrainosus: Secondary | ICD-10-CM

## 2019-06-16 DIAGNOSIS — E039 Hypothyroidism, unspecified: Secondary | ICD-10-CM

## 2019-06-16 MED ORDER — ALBUTEROL SULFATE HFA 108 (90 BASE) MCG/ACT IN AERS: 2.0000 | INHALATION_SPRAY | RESPIRATORY_TRACT | 1 refills | Status: DC | PRN

## 2019-06-16 NOTE — Patient Instructions (Addendum)
[name] , Thank you for taking time to come for your Annual Wellness Visit. I appreciate your ongoing commitment to your health goals. Please review the following plan we discussed and let me know if I can assist you in the future.   These are the goals we discussed: Goals    . Weight (lb) < 180 lb (81.6 kg)       This is a list of the screening recommended for you and due dates:  Health Maintenance  Topic Date Due  . Mammogram  05/22/2019  . Colon Cancer Screening  01/30/2021  . Pap Smear  05/03/2021  . Tetanus Vaccine  05/03/2026  . Flu Shot  Completed  .  Hepatitis C: One time screening is recommended by Center for Disease Control  (CDC) for  adults born from [birth year] through 1964.   Completed  . HIV Screening  Completed     Know what a healthy weight is for you (roughly BMI <25) and aim to maintain this  Aim for 7+ servings of fruits and vegetables daily  65-80+ fluid ounces of water or unsweet tea for healthy kidneys  Limit to max 1 drink of alcohol per day; avoid smoking/tobacco  Limit animal fats in diet for cholesterol and heart health - choose grass fed whenever available  Avoid highly processed foods, and foods high in saturated/trans fats  Aim for low stress - take time to unwind and care for your mental health  Aim for 150 min of moderate intensity exercise weekly for heart health, and weights twice weekly for bone health  Aim for 7-9 hours of sleep daily     Drink 1/2 your body weight in fluid ounces of water daily; drink a tall glass of water 30 min before meals  Don't eat until you're stuffed- listen to your stomach and eat until you are 80% full   Try eating off of a salad plate; wait 10 min after finishing before going back for seconds  Start by eating the vegetables on your plate; aim for 50% of your meals to be fruits or vegetables  Then eat your protein - lean meats (grass fed if possible), fish, beans, nuts in moderation  Eat your carbs/starch  last ONLY if you still are hungry. If you can, stop before finishing it all  Avoid sugar and flour - the closer it looks to it's original form in nature, typically the better it is for you  Splurge in moderation - "assign" days when you get to splurge and have the "bad stuff" - I like to follow a 80% - 20% plan- "good" choices 80 % of the time, "bad" choices in moderation 20% of the time  Simple equation is: Calories out > calories in = weight loss - even if you eat the bad stuff, if you limit portions, you will still lose weight     8 Critical Weight-Loss Tips That Aren't Diet and Exercise  1. STARVE THE DISTRACTIONS  All too often when we eat, we're also multitasking: watching TV, answering emails, scrolling through social media. These habits are detrimental to having a strong, clear, healthy relationship with food, and they can hinder our ability to make dietary changes.  In order to truly focus on what you're eating, how much you're eating, why you're eating those specific foods and, most importantly, how those foods make you feel, you need to starve the distractions. That means when you eat, just eat. Focus on your food, the process it went  through to end up on your plate, where it came from and how it nourishes you. With this technique, you're more likely to finish a meal feeling satiated.  2.  CONSIDER WHAT YOU'RE NOT WILLING TO DO  This might sound counterintuitive, but it can help provide a "why" when motivation is waning. Declare, in writing, what you are unwilling to do, for example "I am unwilling to be the old dad who cannot play sports with my children".  So consider what you're not willing to accept, write it down, and keep it at the ready.  3.  STOP LABELING FOOD "GOOD" AND "BAD"  You've probably heard someone say they ate something "bad." Maybe you've even said it yourself.  The trouble with 'bad' foods isn't that they'll send you to the grave after a bite or two. The  trouble comes when we eat excessive portions of really calorie-dense foods meal after meal, day after day.  Instead of labeling foods as good or bad, think about which foods you can eat a lot of, and which ones you should just eat a little of. Then, plan ways to eat the foods you really like in portions that fit with your overall goals. A good example of this would be having a slice of pizza alongside a club salad with chicken breast, avocado and a bit of dressing. This is vastly different than 3 slices of pizza, 4 breadsticks with cheese sauce and half of a liter of regular soda.  4.  BRUSH YOUR TEETH AFTER YOU EAT  Getting your mindset in order is important, but sometimes small habits can make a big difference. After eating, you still have the taste of food in their mouth, which often causes people to eat more even if they are full or engage in a nibble or two of dessert.  Brushing your teeth will remove the taste of food from your mouth, and the clean, minty freshness will serve as a cue that mealtime is over.  5.  FOCUS ON CROWDING NOT CUTTING  The most common first step during 'dieting' is to cut. We cut our portion sizes down, we cut out 'bad' foods, we cut out entire food groups. This act of cutting puts [place] and our minds into scarcity mode.  When something is off-limits, even if you're able to avoid it for a while, you could end up bingeing on it later because you've gone so long without it. So, instead of cutting, focus on crowding. If you crowd your plate and fill it up with more foods like veggies and protein, it simply allows less room for the other stuff. In other words, shift your focus away from what you can't eat, and celebrate the foods that will help you reach your goals.  6.  TAKE TRACKING A STEP FURTHER  Track what you eat, when you ate it, how much you ate and how that food made you feel. Being completely honest with yourself and writing down every single thing that passes through  your lips will help you start to notice that maybe you actually do snack, possibly take in more sugar than you thought, eat when you're bored rather than just hungry or maybe that you have a habit of snacking before bed while watching TV.  The difference from simply tracking your food intake is you're taking into account how food makes you feel, as well as what you're doing while you're eating. This is about becoming more mindful of what, when and why  you eat.  7.  PRIORITIZE GOOD SLEEP  One of the strongest risk factors for being overweight is poor sleep. When you're feeling tired, you're more likely to choose unhealthy comfort foods and to skip your workout. Additionally, sleep deprivation may slow down your metabolism. [employer]! Therefore, sleeping 7-8 hours per night can help with weight loss without having to change your diet or increase your physical activity. And if you feel you snore and still wake up tired, talk with me about sleep apnea.  8.  SET ASIDE TIME TO DISCONNECT  Just get out there. Disconnect from the electronics and connect to the elements. Not only will this help reduce stress (a major factor in weight gain) by giving your mind a break from the constant stimulation we've all become so accustomed to, but it may also reprogram your brain to connect with yourself and what you're feeling.

## 2019-06-17 ENCOUNTER — Other Ambulatory Visit: Payer: Self-pay | Admitting: Adult Health

## 2019-06-17 ENCOUNTER — Other Ambulatory Visit: Payer: Self-pay

## 2019-06-17 LAB — URINALYSIS, ROUTINE W REFLEX MICROSCOPIC
Bilirubin Urine: NEGATIVE
Glucose, UA: NEGATIVE
Hgb urine dipstick: NEGATIVE
Ketones, ur: NEGATIVE
Leukocytes,Ua: NEGATIVE
Nitrite: NEGATIVE
Protein, ur: NEGATIVE
Specific Gravity, Urine: 1.008 (ref 1.001–1.03)
pH: 7.5 (ref 5.0–8.0)

## 2019-06-17 LAB — COMPLETE METABOLIC PANEL WITH GFR
AG Ratio: 1.7 (calc) (ref 1.0–2.5)
ALT: 18 U/L (ref 6–29)
AST: 17 U/L (ref 10–35)
Albumin: 4.6 g/dL (ref 3.6–5.1)
Alkaline phosphatase (APISO): 61 U/L (ref 37–153)
BUN: 14 mg/dL (ref 7–25)
CO2: 26 mmol/L (ref 20–32)
Calcium: 9.8 mg/dL (ref 8.6–10.4)
Chloride: 107 mmol/L (ref 98–110)
Creat: 0.78 mg/dL (ref 0.50–1.05)
GFR, Est African American: 98 mL/min/{1.73_m2} (ref >=60)
GFR, Est Non African American: 84 mL/min/{1.73_m2} (ref >=60)
Globulin: 2.7 g/dL (calc) (ref 1.9–3.7)
Glucose, Bld: 85 mg/dL (ref 65–99)
Potassium: 4.3 mmol/L (ref 3.5–5.3)
Sodium: 141 mmol/L (ref 135–146)
Total Bilirubin: 0.3 mg/dL (ref 0.2–1.2)
Total Protein: 7.3 g/dL (ref 6.1–8.1)

## 2019-06-17 LAB — LIPID PANEL
Cholesterol: 224 mg/dL — ABNORMAL HIGH (ref <200)
HDL: 74 mg/dL (ref >=50)
LDL Cholesterol (Calc): 125 mg/dL (calc) — ABNORMAL HIGH
Non-HDL Cholesterol (Calc): 150 mg/dL (calc) — ABNORMAL HIGH (ref <130)
Total CHOL/HDL Ratio: 3 (calc) (ref <5.0)
Triglycerides: 135 mg/dL (ref <150)

## 2019-06-17 LAB — IRON,TIBC AND FERRITIN PANEL
%SAT: 31 % (calc) (ref 16–45)
Ferritin: 64 ng/mL (ref 16–232)
Iron: 94 ug/dL (ref 45–160)
TIBC: 305 mcg/dL (calc) (ref 250–450)

## 2019-06-17 LAB — CBC WITH DIFFERENTIAL/PLATELET
Absolute Monocytes: 423 cells/uL (ref 200–950)
Basophils Absolute: 70 cells/uL (ref 0–200)
Basophils Relative: 1.2 %
Eosinophils Absolute: 290 cells/uL (ref 15–500)
Eosinophils Relative: 5 %
HCT: 39.8 % (ref 35.0–45.0)
Hemoglobin: 13.3 g/dL (ref 11.7–15.5)
Lymphs Abs: 1665 cells/uL (ref 850–3900)
MCH: 30.6 pg (ref 27.0–33.0)
MCHC: 33.4 g/dL (ref 32.0–36.0)
MCV: 91.7 fL (ref 80.0–100.0)
MPV: 10 fL (ref 7.5–12.5)
Monocytes Relative: 7.3 %
Neutro Abs: 3352 cells/uL (ref 1500–7800)
Neutrophils Relative %: 57.8 %
Platelets: 311 10*3/uL (ref 140–400)
RBC: 4.34 10*6/uL (ref 3.80–5.10)
RDW: 12.1 % (ref 11.0–15.0)
Total Lymphocyte: 28.7 %
WBC: 5.8 10*3/uL (ref 3.8–10.8)

## 2019-06-17 LAB — MAGNESIUM: Magnesium: 2.4 mg/dL (ref 1.5–2.5)

## 2019-06-17 LAB — TSH: TSH: 1.57 mIU/L (ref 0.40–4.50)

## 2019-06-17 LAB — MICROALBUMIN / CREATININE URINE RATIO
Creatinine, Urine: 26 mg/dL (ref 20–275)
Microalb, Ur: <0.2 mg/dL

## 2019-06-17 LAB — HEMOGLOBIN A1C
Hgb A1c MFr Bld: 5.6 % of total Hgb (ref <5.7)
Mean Plasma Glucose: 114 (calc)
eAG (mmol/L): 6.3 (calc)

## 2019-06-17 LAB — VITAMIN B12: Vitamin B-12: 590 pg/mL (ref 200–1100)

## 2019-06-17 LAB — VITAMIN D 25 HYDROXY (VIT D DEFICIENCY, FRACTURES): Vit D, 25-Hydroxy: 41 ng/mL (ref 30–100)

## 2019-06-23 ENCOUNTER — Ambulatory Visit: Payer: 59

## 2019-08-08 ENCOUNTER — Ambulatory Visit: Payer: 59

## 2019-08-21 ENCOUNTER — Other Ambulatory Visit: Payer: Self-pay | Admitting: Internal Medicine

## 2019-11-03 ENCOUNTER — Other Ambulatory Visit: Payer: Self-pay | Admitting: Internal Medicine

## 2019-11-06 ENCOUNTER — Encounter: Payer: Self-pay | Admitting: Adult Health Nurse Practitioner

## 2019-11-06 ENCOUNTER — Other Ambulatory Visit: Payer: Self-pay

## 2019-11-06 ENCOUNTER — Ambulatory Visit: Payer: 59 | Admitting: Adult Health Nurse Practitioner

## 2019-11-06 VITALS — BP 124/76 | HR 74 | Temp 97.5°F | Ht 67.0 in | Wt 198.0 lb

## 2019-11-06 DIAGNOSIS — R10817 Generalized abdominal tenderness: Secondary | ICD-10-CM

## 2019-11-06 DIAGNOSIS — R1011 Right upper quadrant pain: Secondary | ICD-10-CM

## 2019-11-06 DIAGNOSIS — R140 Abdominal distension (gaseous): Secondary | ICD-10-CM

## 2019-11-06 LAB — CBC WITH DIFFERENTIAL/PLATELET
Absolute Monocytes: 460 cells/uL (ref 200–950)
Basophils Absolute: 71 cells/uL (ref 0–200)
Basophils Relative: 1.2 %
Eosinophils Absolute: 301 cells/uL (ref 15–500)
Eosinophils Relative: 5.1 %
HCT: 41.6 % (ref 35.0–45.0)
Hemoglobin: 14.2 g/dL (ref 11.7–15.5)
Lymphs Abs: 1817 cells/uL (ref 850–3900)
MCH: 31.2 pg (ref 27.0–33.0)
MCHC: 34.1 g/dL (ref 32.0–36.0)
MCV: 91.4 fL (ref 80.0–100.0)
MPV: 10.1 fL (ref 7.5–12.5)
Monocytes Relative: 7.8 %
Neutro Abs: 3251 cells/uL (ref 1500–7800)
Neutrophils Relative %: 55.1 %
Platelets: 311 10*3/uL (ref 140–400)
RBC: 4.55 10*6/uL (ref 3.80–5.10)
RDW: 12.2 % (ref 11.0–15.0)
Total Lymphocyte: 30.8 %
WBC: 5.9 10*3/uL (ref 3.8–10.8)

## 2019-11-06 LAB — COMPLETE METABOLIC PANEL WITH GFR
AG Ratio: 1.9 (calc) (ref 1.0–2.5)
ALT: 18 U/L (ref 6–29)
AST: 17 U/L (ref 10–35)
Albumin: 4.5 g/dL (ref 3.6–5.1)
Alkaline phosphatase (APISO): 62 U/L (ref 37–153)
BUN: 13 mg/dL (ref 7–25)
CO2: 29 mmol/L (ref 20–32)
Calcium: 9.6 mg/dL (ref 8.6–10.4)
Chloride: 105 mmol/L (ref 98–110)
Creat: 0.78 mg/dL (ref 0.50–1.05)
GFR, Est African American: 98 mL/min/{1.73_m2} (ref >=60)
GFR, Est Non African American: 84 mL/min/{1.73_m2} (ref >=60)
Globulin: 2.4 g/dL (calc) (ref 1.9–3.7)
Glucose, Bld: 99 mg/dL (ref 65–99)
Potassium: 4.7 mmol/L (ref 3.5–5.3)
Sodium: 140 mmol/L (ref 135–146)
Total Bilirubin: 0.5 mg/dL (ref 0.2–1.2)
Total Protein: 6.9 g/dL (ref 6.1–8.1)

## 2019-11-06 NOTE — Progress Notes (Signed)
Assessment and Plan:  Samantha Duran was seen today for gi problem and flank pain.  Diagnoses and all orders for this visit:  RUQ abdominal pain -     CBC with Differential/Platelet -     COMPLETE METABOLIC PANEL WITH GFR -     US Abdomen Complete; Future  Generalized abdominal tenderness without rebound tenderness Abdominal bloating -ABD ultrasound Discussed monitoring dietary intake and triggers  probiotic or simethocone after ultrasound?  Contact office with any new or worsening symptoms.   Further disposition pending results of labs. Discussed med's effects and SE's.   Over 20 minutes of face to face interview, exam, counseling, chart review, and critical decision making was performed.   Future Appointments  Date Time Provider [place]  11/17/2019  9:00 AM [facility] [place] [facility]  12/15/2019  8:45 AM Samantha Comber, NP [facility] None  06/14/2020  2:00 PM Samantha Comber, NP [facility] None    ------------------------------------------------------------------------------------------------------------------   HPI 59 y.o.female presents to discuss abdominal bloating.    She reports that a couple months ago she reports she felt a pop on the right side that is sore.  Not sure if that had anything to do with how she is feeling.  Her RLQ remained sore but it is intermittent.  She reports it mainly has tenderness with movement and it is 6/10 pain. Denies excessive flatulence or burping. She reports her bowel movements are type 4 on [place] stool chart and she goes daily.  She has not had any constipation or diarrhea. She typical eats lunch and dinner.  She works second shift.  11am is the first meal and second meal is around 7pm.  She reports she monitors her water intake with an app and she  Reports 64 -72 daily and sometimes more.    Past Medical History:  Diagnosis Date  . Anemia    iron  . Asthma   . Depression   . GERD (gastroesophageal reflux disease)   .  History of adenomatous polyp of colon 05/29/2019  . Hyperlipidemia   . Hypertension   . Migraine   . 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 Unspecified hypothyroidism 03/06/2013   Secondary to thyroid cancer and RAI treatment  . Vitamin D deficiency      No Known Allergies  Current Outpatient Medications on File Prior to Visit  Medication Sig  . ADVAIR DISKUS 100-50 MCG/DOSE AEPB USE 1 INHALATION BY MOUTH  TWICE DAILY EVERY 12 HOURS  . albuterol (VENTOLIN HFA) 108 (90 Base) MCG/ACT inhaler Inhale 2 puffs into the lungs every 4 (four) hours as needed for wheezing or shortness of breath.  . Ascorbic Acid (VITAMIN C) 100 MG CHEW Chew by mouth daily.  Marland Kitchen aspirin 81 MG tablet Take 81 mg by mouth daily.  Marland Kitchen b complex vitamins tablet Take 1 tablet by mouth daily.  . Calcium Carbonate-Vitamin D (CALCIUM + D PO) Take by mouth.  . cholecalciferol (VITAMIN D) 1000 UNITS tablet Take 1,000 Units by mouth 2 (two) times daily. Takes 10,000 units daily  . Coenzyme Q10 (CO Q 10 PO) Take 200 mg by mouth.  . levothyroxine (SYNTHROID) 125 MCG tablet Take 1 tablet daily on an empty stomach with only water for 30 minutes & no Antacid meds, Calcium or Magnesium for 4 hours & avoid Biotin  . Magnesium 500 MG TABS Take by mouth daily.  . Multiple Vitamin (MULTIVITAMIN) tablet Take 1 tablet by mouth daily.  Marland Kitchen OVER THE COUNTER MEDICATION Takes Elzie Rings  daily.  . TURMERIC PO Take by mouth daily.  . Zinc 50 MG TABS Take by mouth daily.  . Nutritional Supplements (OSTEO ADVANCE PO) Take by mouth.   No current facility-administered medications on file prior to visit.    Past Surgical History:  Procedure Laterality Date  . APPENDECTOMY     80s  . MANDIBLE SURGERY Right    TMJ, Dr. Diona Foley, 90s  . TOTAL THYROIDECTOMY  06/2010   secondary to cancer   Family History  Problem Relation Age of Onset  . Stomach cancer Maternal Aunt   . Diabetes Paternal Grandmother   . Stroke Paternal Grandfather   . Colon  cancer Neg Hx   . Pancreatic cancer Neg Hx   . Rectal cancer Neg Hx     ROS: all negative except above.   Physical Exam:  BP 124/76   Pulse 74   Temp (!) 97.5 F (36.4 C)   Ht 5\' 7"  (1.702 m)   Wt 198 lb (89.8 kg)   SpO2 97%   BMI 31.01 kg/m   General Appearance: Well nourished, in no apparent distress. Eyes: PERRLA, EOMs, conjunctiva no swelling or erythema Sinuses: No Frontal/maxillary tenderness ENT/Mouth: Ext aud canals clear, TMs without erythema, bulging. No erythema, swelling, or exudate on post pharynx.  Tonsils not swollen or erythematous. Hearing normal.  Neck: Supple, thyroid normal.  Respiratory: Respiratory effort normal, BS equal bilaterally without rales, rhonchi, wheezing or stridor.  Cardio: RRR with no MRGs. Brisk peripheral pulses without edema.  Abdomen: Soft, + BS.  Mild general tender, no guarding, rebound, hernias, masses. Lymphatics: Non tender without lymphadenopathy.  Musculoskeletal: Full ROM, 5/5 strength, normal gait.  Skin: Warm, dry without rashes, lesions, ecchymosis.  Neuro: Cranial nerves intact. Normal muscle tone, no cerebellar symptoms. Sensation intact.  Psych: Awake and oriented X 3, normal affect, Insight and Judgment appropriate.     Samantha Sierras, NP 12:45 PM [facility]

## 2019-11-10 ENCOUNTER — Other Ambulatory Visit: Payer: Self-pay | Admitting: Internal Medicine

## 2019-11-17 ENCOUNTER — Ambulatory Visit
Admission: RE | Admit: 2019-11-17 | Discharge: 2019-11-17 | Disposition: A | Discharge status: Home / Self Care | Payer: 59 | Source: Ambulatory Visit | Attending: Adult Health Nurse Practitioner | Admitting: Adult Health Nurse Practitioner

## 2019-11-17 DIAGNOSIS — R1011 Right upper quadrant pain: Secondary | ICD-10-CM

## 2019-12-15 ENCOUNTER — Ambulatory Visit: Payer: 59 | Admitting: Adult Health

## 2020-02-03 ENCOUNTER — Other Ambulatory Visit: Payer: Self-pay | Admitting: Internal Medicine

## 2020-05-06 ENCOUNTER — Other Ambulatory Visit: Payer: Self-pay | Admitting: Internal Medicine

## 2020-05-06 ENCOUNTER — Telehealth: Payer: Self-pay | Admitting: *Deleted

## 2020-05-06 MED ORDER — DEXAMETHASONE 4 MG PO TABS: ORAL_TABLET | ORAL | 0 refills | Status: DC

## 2020-05-06 MED ORDER — PROMETHAZINE-DM 6.25-15 MG/5ML PO SYRP: ORAL_SOLUTION | ORAL | 1 refills | Status: DC

## 2020-05-06 NOTE — Telephone Encounter (Signed)
Returned a call to the patient's souse. Patient has cough and congestion, but no positive Covid test. Spouse is positive. Dr Oneta Rack will send in RX for patient to her pharmacy.

## 2020-05-10 ENCOUNTER — Other Ambulatory Visit: Payer: Self-pay | Admitting: Internal Medicine

## 2020-05-20 ENCOUNTER — Encounter: Payer: 59 | Admitting: Physician Assistant

## 2020-06-08 ENCOUNTER — Encounter: Payer: 59 | Admitting: Adult Health

## 2020-06-14 ENCOUNTER — Encounter: Payer: 59 | Admitting: Adult Health Nurse Practitioner

## 2020-06-15 ENCOUNTER — Encounter: Payer: 59 | Admitting: Adult Health

## 2020-08-31 ENCOUNTER — Encounter: Payer: 59 | Admitting: Adult Health Nurse Practitioner

## 2020-10-14 NOTE — Progress Notes (Signed)
Complete Physical  Assessment and Plan:  Encounter for general adult medical examination with abnormal findings 1 year Schedule mammogram  Check about shingrix   Essential hypertension - continue medications, DASH diet, exercise and monitor at home. Call if greater than 130/80.  -     CBC with Differential/Platelet -     CMP/GFR -     TSH -     Urinalysis, Routine w reflex microscopic -     Microalbumin / creatinine urine ratio -     EKG  Hypothyroidism, unspecified type Hypothyroidism-check TSH level, continue medications the same, reminded to take on an empty stomach 30-68mins before food. STOP biotin  -     TSH  Hyperlipidemia, unspecified hyperlipidemia type -continue medications, check lipids, decrease fatty foods, increase activity.  - LDL goal <100; discussed medication if 130+ - she is on new weight loss/lifestyle program, would like to pursue for 6 months and defer medications - follow up 6 months  -     Lipid panel  Uncomplicated asthma, unspecified asthma severity, unspecified whether persistent Controlled Refill Advair, albuterol PRN, try 1 puff 15 min prior to exercise  Gastroesophageal reflux disease with esophagitis Recently well controlled by lifestyle, PRN tums; monitor   Recurrent major depressive disorder, in full remission ([place]) - Remains in remission off of medications stress management techniques discussed, increase water, good sleep hygiene discussed, increase exercise, and increase veggies.   Vitamin D deficiency -     VITAMIN D 25 Hydroxy (Vit-D Deficiency, Fractures)  Medication management -     Magnesium  Orders Placed This Encounter  Procedures   MM Digital Screening   CBC with Differential/Platelet   COMPLETE METABOLIC PANEL WITH GFR   Magnesium   Lipid panel   TSH   Hemoglobin A1c   VITAMIN D 25 Hydroxy (Vit-D Deficiency, Fractures)   Microalbumin / creatinine urine ratio   Urinalysis, Routine w reflex microscopic   EKG 12-Lead     Discussed med's effects and SE's. Screening labs and tests as requested with regular follow-up as recommended. Over 40 minutes of exam, counseling, chart review, and complex, high level critical decision making was performed this visit.   Future Appointments  Date Time Provider [place]  10/18/2021  9:00 AM Liane Comber, NP [facility] None     HPI  60 y.o. female  presents for a complete physical. She has Hypertension; Hyperlipidemia; GERD (gastroesophageal reflux disease); Asthma; Depression; Vitamin D deficiency; Hypothyroidism; Obesity (BMI 30.0-34.9); and History of adenomatous polyp of colon on their problem list.   She is married, 2 daughters, 3 grandsons, close by and sees frequently.  She works at [employer], retired from Museum/gallery curator.   Mild persistent asthma, advair BID and reports does very well with this. No recent flares.   GERD well controlled with lifestyle modification, rare tums.   She has hx of major depression, in remission off of medications.  PHQ-9 of 0 today. Reports sleeping well.   BMI is Body mass index is 32.44 kg/m., she has been working on diet and exercise. She would like to lose 8 lb, doing intermittent fasting.  Counting steps for goal of [id number].  Water; 70-80 fluid ounces No alcohol, 12-14 ounces of coffee daily, rare soda, switching to green tea Sleeps well  Trying to get down to <180 lb  Wt Readings from Last 3 Encounters:  10/18/20 201 lb (91.2 kg)  11/06/19 198 lb (89.8 kg)  06/16/19 201 lb 6.4 oz (91.4 kg)   Today  their BP is BP: 128/80 She does workout. She denies chest pain, shortness of breath, dizziness.   She is not on cholesterol medication and denies myalgias. Her cholesterol is not at goal, working on diet this year and prefers to avoid adding medication if possible. The cholesterol last visit was:   Lab Results  Component Value Date   CHOL 224 (H) 06/16/2019   HDL 74 06/16/2019   LDLCALC 125 (H) 06/16/2019    TRIG 135 06/16/2019   CHOLHDL 3.0 06/16/2019    Last A1C in the office was:  Lab Results  Component Value Date   HGBA1C 5.6 06/16/2019   Patient is on Vitamin D supplement, taking 10000 IU   Lab Results  Component Value Date   VD25OH 27 06/16/2019     She is on thyroid medication for history of thyroid cancer s/p thyroidectomy in 2012, was following with Dr. Chalmers Cater but was released. Her medication was changed last visit due to palpitations, 125 mcg daily, takes first thing with water, nothing else for 1 hour. She is on a biotin supplement.  Lab Results  Component Value Date   TSH 1.57 06/16/2019        Current Medications:  Current Outpatient Medications on File Prior to Visit  Medication Sig Dispense Refill   ADVAIR DISKUS 100-50 MCG/DOSE AEPB USE 1 INHALATION BY MOUTH  TWICE DAILY EVERY 12 HOURS 180 each 3   Ascorbic Acid (VITAMIN C) 100 MG CHEW Chew by mouth daily.     aspirin 81 MG tablet Take 81 mg by mouth daily.     b complex vitamins tablet Take 1 tablet by mouth daily.     Calcium Carbonate-Vitamin D (CALCIUM + D PO) Take by mouth.     cholecalciferol (VITAMIN D) 1000 UNITS tablet Take 1,000 Units by mouth 2 (two) times daily. Takes 10,000 units daily     Coenzyme Q10 (CO Q 10 PO) Take 200 mg by mouth.     levothyroxine (SYNTHROID) 125 MCG tablet TAKE 1 TABLET BY MOUTH DAILY ON AN EMPTY STOMACH WITH ONLY WATER FOR 30 MINUTES & NO ANTACID MEDS, CALCIUM OR MAGNESIUM FOR 4 HOURS & AVOID BIOTIN 90 tablet 3   Magnesium 500 MG TABS Take by mouth daily.     Multiple Vitamin (MULTIVITAMIN) tablet Take 1 tablet by mouth daily.     TURMERIC PO Take by mouth daily.     Zinc 50 MG TABS Take by mouth daily.     albuterol (VENTOLIN HFA) 108 (90 Base) MCG/ACT inhaler Inhale 2 puffs into the lungs every 4 (four) hours as needed for wheezing or shortness of breath. (Patient not taking: Reported on 10/18/2020) 18 g 1   No current facility-administered medications on file prior to  visit.   Allergies:  Not on File   Medical History:  She has Hypertension; Hyperlipidemia; GERD (gastroesophageal reflux disease); Asthma; Depression; Vitamin D deficiency; Hypothyroidism; Obesity (BMI 30.0-34.9); and History of adenomatous polyp of colon on their problem list.   Health Maintenance:   Immunization History  Administered Date(s) Administered   Influenza Inj Mdck Quad With Preservative 03/25/2017, 02/06/2018, 02/03/2019   Influenza,inj,quad, With Preservative 02/09/2016   Influenza-Unspecified 02/10/2012, 01/29/2014   PFIZER(Purple Top)SARS-COV-2 Vaccination 08/11/2019, 09/01/2019   PPD Test 03/23/2014   Pneumococcal-Unspecified 03/05/2008   Tdap 02/17/2007, 05/03/2016     Tetanus: 2018 Pneumovax: 2009 Prevnar due 65 Flu vaccine: 2021 Shingrix: Check with insurance Covid 19: 2/2, pfizer   No LMP recorded. (Menstrual status: Perimenopausal). Pap: 2018  never abnormal pap neg HPV will repeat 5 years, due 2023 MGM: 06/2019 - due ,order  Colonoscopy: 01/2014 Dr. Fuller Plan adenomas- per patient got letter, not due until 01/2021  Last Dental Exam:  Dr. Randol Kern, last 2022, goes q50m Last Eye Exam:  Dr. Einar Gip, last 2021  Patient Care Team: Unk Pinto, MD as PCP - General (Internal Medicine) Webb Laws, [place] as Referring Physician (Optometry) Ladene Artist, MD as Consulting Physician (Gastroenterology)  Surgical History:  She has a past surgical history that includes Appendectomy; Total thyroidectomy (06/2010); and Mandible surgery ([place]). Family History:  Herfamily history includes Diabetes in her paternal grandmother; Stomach cancer in her maternal aunt; Stroke in her paternal grandfather. Social History:  She reports that she quit smoking about 33 years ago. Her smoking use included cigarettes. She has a 5.00 pack-year smoking history. She has never used smokeless tobacco. She reports that she does not drink alcohol and does not use drugs.  Review  of Systems: Review of Systems  Constitutional: Negative.  Negative for malaise/fatigue and weight loss.  HENT: Negative.  Negative for hearing loss and tinnitus.   Eyes: Negative.  Negative for blurred vision and double vision.  Respiratory: Negative.  Negative for cough, shortness of breath and wheezing.   Cardiovascular: Negative.  Negative for chest pain, palpitations, orthopnea, claudication and leg swelling.  Gastrointestinal: Negative.  Negative for abdominal pain, blood in stool, constipation, diarrhea, heartburn, melena, nausea and vomiting.  Genitourinary: Negative.   Musculoskeletal: Negative.  Negative for joint pain and myalgias.  Skin: Negative.  Negative for rash.  Neurological: Negative.  Negative for dizziness, tingling, sensory change, weakness and headaches.  Endo/Heme/Allergies: Negative.  Negative for polydipsia.  Psychiatric/Behavioral: Negative.    All other systems reviewed and are negative.  Physical Exam: Estimated body mass index is 32.44 kg/m as calculated from the following:   Height as of this encounter: 5\' 6"  (1.676 m).   Weight as of this encounter: 201 lb (91.2 kg). BP 128/80   Pulse 78   Temp (!) 96.8 F (36 C)   Ht 5\' 6"  (1.676 m)   Wt 201 lb (91.2 kg)   SpO2 97%   BMI 32.44 kg/m  General Appearance: Well nourished, in no apparent distress.  Eyes: PERRLA, EOMs, conjunctiva no swelling or erythema Sinuses: No Frontal/maxillary tenderness  ENT/Mouth: Ext aud canals clear, normal light reflex with TMs without erythema, bulging. Good dentition. No erythema, swelling, or exudate on post pharynx. Tonsils not swollen or erythematous. Hearing normal.  Neck: Supple, well healed surgical scar at base of neck, no lumps or nodules. No bruits  Respiratory: Respiratory effort normal, BS equal bilaterally without rales, rhonchi, wheezing or stridor.  Cardio: RRR without murmurs, without rubs or gallops. Brisk peripheral pulses without edema.  Chest: symmetric,  with normal excursions and percussion.  Breasts: Defer per shared decision making, she will get mammogram  Abdomen: Soft, nontender, no guarding, rebound, hernias, masses, or organomegaly.  Lymphatics: Non tender without lymphadenopathy.  Genitourinary: defer Musculoskeletal: Full ROM all peripheral extremities,5/5 strength, and normal gait.  Skin: Warm, dry without rashes, lesions, ecchymosis. Neuro: Cranial nerves intact, reflexes equal bilaterally. Normal muscle tone, no cerebellar symptoms. Sensation intact.  Psych: Awake and oriented X 3, normal affect, Insight and Judgment appropriate.   EKG: NSR, NSCPT  Gorden Harms Amoreena Neubert 10:30 AM [facility]

## 2020-10-18 ENCOUNTER — Ambulatory Visit (INDEPENDENT_AMBULATORY_CARE_PROVIDER_SITE_OTHER): Payer: PRIVATE HEALTH INSURANCE | Admitting: Adult Health

## 2020-10-18 ENCOUNTER — Other Ambulatory Visit: Payer: Self-pay

## 2020-10-18 ENCOUNTER — Encounter: Payer: Self-pay | Admitting: Adult Health

## 2020-10-18 VITALS — BP 128/80 | HR 78 | Temp 96.8°F | Ht 66.0 in | Wt 201.0 lb

## 2020-10-18 DIAGNOSIS — Z1389 Encounter for screening for other disorder: Secondary | ICD-10-CM | POA: Diagnosis not present

## 2020-10-18 DIAGNOSIS — Z860101 Personal history of adenomatous and serrated colon polyps: Secondary | ICD-10-CM

## 2020-10-18 DIAGNOSIS — Z0000 Encounter for general adult medical examination without abnormal findings: Secondary | ICD-10-CM | POA: Diagnosis not present

## 2020-10-18 DIAGNOSIS — Z131 Encounter for screening for diabetes mellitus: Secondary | ICD-10-CM | POA: Diagnosis not present

## 2020-10-18 DIAGNOSIS — I10 Essential (primary) hypertension: Secondary | ICD-10-CM

## 2020-10-18 DIAGNOSIS — E669 Obesity, unspecified: Secondary | ICD-10-CM

## 2020-10-18 DIAGNOSIS — E039 Hypothyroidism, unspecified: Secondary | ICD-10-CM

## 2020-10-18 DIAGNOSIS — Z79899 Other long term (current) drug therapy: Secondary | ICD-10-CM

## 2020-10-18 DIAGNOSIS — Z136 Encounter for screening for cardiovascular disorders: Secondary | ICD-10-CM | POA: Diagnosis not present

## 2020-10-18 DIAGNOSIS — J4530 Mild persistent asthma, uncomplicated: Secondary | ICD-10-CM

## 2020-10-18 DIAGNOSIS — Z1231 Encounter for screening mammogram for malignant neoplasm of breast: Secondary | ICD-10-CM

## 2020-10-18 DIAGNOSIS — Z13220 Encounter for screening for lipoid disorders: Secondary | ICD-10-CM | POA: Diagnosis not present

## 2020-10-18 DIAGNOSIS — E559 Vitamin D deficiency, unspecified: Secondary | ICD-10-CM | POA: Diagnosis not present

## 2020-10-18 DIAGNOSIS — F3342 Major depressive disorder, recurrent, in full remission: Secondary | ICD-10-CM

## 2020-10-18 DIAGNOSIS — Z86010 Personal history of colonic polyps: Secondary | ICD-10-CM

## 2020-10-18 DIAGNOSIS — E785 Hyperlipidemia, unspecified: Secondary | ICD-10-CM

## 2020-10-18 DIAGNOSIS — K2100 Gastro-esophageal reflux disease with esophagitis, without bleeding: Secondary | ICD-10-CM

## 2020-10-18 DIAGNOSIS — E66811 Obesity, class 1: Secondary | ICD-10-CM

## 2020-10-18 NOTE — Patient Instructions (Addendum)
[name] , Thank you for taking time to come for your Annual Wellness Visit. I appreciate your ongoing commitment to your health goals. Please review the following plan we discussed and let me know if I can assist you in the future.   These are the goals we discussed:  Goals      Weight (lb) < 180 lb (81.6 kg)        This is a list of the screening recommended for you and due dates:  Health Maintenance  Topic Date Due   Pneumococcal Vaccination (1 - PCV) Never done   Zoster (Shingles) Vaccine (1 of 2) Never done   COVID-19 Vaccine (3 - Pfizer risk series) 09/29/2019   Flu Shot  11/29/2020   Colon Cancer Screening  01/30/2021   Pap Smear  05/03/2021   Mammogram  06/15/2021   Tetanus Vaccine  05/03/2026   Hepatitis C Screening: USPSTF Recommendation to screen - Ages 18-79 yo.  Completed   HIV Screening  Completed   HPV Vaccine  Aged Out      Check with insurance about shingrix  HOW TO SCHEDULE A MAMMOGRAM  [facility]  7 a.m.-6:30 p.m., Monday 7 a.m.-5 p.m., Tuesday-Friday Schedule an appointment by calling [phone].    Know what a healthy weight is for you (roughly BMI <25) and aim to maintain this  Aim for 7+ servings of fruits and vegetables daily  65-80+ fluid ounces of water or unsweet tea for healthy kidneys  Limit to max 1 drink of alcohol per day; avoid smoking/tobacco  Limit animal fats in diet for cholesterol and heart health - choose grass fed whenever available  Avoid highly processed foods, and foods high in saturated/trans fats  Aim for low stress - take time to unwind and care for your mental health  Aim for 150 min of moderate intensity exercise weekly for heart health, and weights twice weekly for bone health  Aim for 7-9 hours of sleep daily     High-Fiber Eating Plan Fiber, also called dietary fiber, is a type of carbohydrate. It is found foods such as fruits, vegetables, whole grains, and beans. A  high-fiber diet can have many health benefits. Your health care provider may recommend a high-fiber diet to help: Prevent constipation. Fiber can make your bowel movements more regular. Lower your cholesterol. Relieve the following conditions: Inflammation of veins in the anus (hemorrhoids). Inflammation of specific areas of the digestive tract (uncomplicated diverticulosis). A problem of the large intestine, also called the colon, that sometimes causes pain and diarrhea (irritable bowel syndrome, or IBS). Prevent overeating as part of a weight-loss plan. Prevent heart disease, type 2 diabetes, and certain cancers. What are tips for following this plan? Reading food labels  Check the nutrition facts label on food products for the amount of dietary fiber. Choose foods that have 5 grams of fiber or more per serving. The goals for recommended daily fiber intake include: Men (age 79 or younger): 34-38 g. Men (over age 5): 28-34 g. Women (age 42 or younger): 25-28 g. Women (over age 52): 22-25 g. Your daily fiber goal is _____________ g. Shopping Choose whole fruits and vegetables instead of processed forms, such as apple juice or applesauce. Choose a wide variety of high-fiber foods such as avocados, lentils, oats, and kidney beans. Read the nutrition facts label of the foods you choose. Be aware of foods with added fiber. These foods often have high sugar and sodium amounts  per serving. Cooking Use whole-grain flour for baking and cooking. Cook with brown rice instead of white rice. Meal planning Start the day with a breakfast that is high in fiber, such as a cereal that contains 5 g of fiber or more per serving. Eat breads and cereals that are made with whole-grain flour instead of refined flour or white flour. Eat brown rice, bulgur wheat, or millet instead of white rice. Use beans in place of meat in soups, salads, and pasta dishes. Be sure that half of the grains you eat each day are  whole grains. General information You can get the recommended daily intake of dietary fiber by: Eating a variety of fruits, vegetables, grains, nuts, and beans. Taking a fiber supplement if you are not able to take in enough fiber in your diet. It is better to get fiber through food than from a supplement. Gradually increase how much fiber you consume. If you increase your intake of dietary fiber too quickly, you may have bloating, cramping, or gas. Drink plenty of water to help you digest fiber. Choose high-fiber snacks, such as berries, raw vegetables, nuts, and popcorn. What foods should I eat? Fruits Berries. Pears. Apples. Oranges. Avocado. Prunes and raisins. Dried figs. Vegetables Sweet potatoes. Spinach. Kale. Artichokes. Cabbage. Broccoli. Cauliflower.Green peas. Carrots. Squash. Grains Whole-grain breads. Multigrain cereal. Oats and oatmeal. Brown rice. Barley.Bulgur wheat. [place]. Quinoa. Bran muffins. Popcorn. Rye wafer crackers. Meats and other proteins Navy beans, kidney beans, and pinto beans. Soybeans. Split peas. Lentils. Nutsand seeds. Dairy Fiber-fortified yogurt. Beverages Fiber-fortified soy milk. Fiber-fortified orange juice. Other foods Fiber bars. The items listed above may not be a complete list of recommended foods and beverages. Contact a dietitian for more information. What foods should I avoid? Fruits Fruit juice. Cooked, strained fruit. Vegetables Fried potatoes. Canned vegetables. Well-cooked vegetables. Grains White bread. Pasta made with refined flour. White rice. Meats and other proteins Fatty cuts of meat. Fried chicken or fried fish. Dairy Milk. Yogurt. Cream cheese. Sour cream. Fats and oils Butters. Beverages Soft drinks. Other foods Cakes and pastries. The items listed above may not be a complete list of foods and beverages to avoid. Talk with your dietitian about what choices are best for you. Summary Fiber is a type of carbohydrate.  It is found in foods such as fruits, vegetables, whole grains, and beans. A high-fiber diet has many benefits. It can help to prevent constipation, lower blood cholesterol, aid weight loss, and reduce your risk of heart disease, diabetes, and certain cancers. Increase your intake of fiber gradually. Increasing fiber too quickly may cause cramping, bloating, and gas. Drink plenty of water while you increase the amount of fiber you consume. The best sources of fiber include whole fruits and vegetables, whole grains, nuts, seeds, and beans. This information is not intended to replace advice given to you by your health care provider. Make sure you discuss any questions you have with your healthcare provider. Document Revised: 08/21/2019 Document Reviewed: 08/21/2019 Elsevier Patient Education  2022 [employer].

## 2020-10-19 ENCOUNTER — Other Ambulatory Visit: Payer: Self-pay | Admitting: Internal Medicine

## 2020-10-19 DIAGNOSIS — Z1231 Encounter for screening mammogram for malignant neoplasm of breast: Secondary | ICD-10-CM

## 2020-10-19 LAB — URINALYSIS, ROUTINE W REFLEX MICROSCOPIC
Bilirubin Urine: NEGATIVE
Glucose, UA: NEGATIVE
Hgb urine dipstick: NEGATIVE
Ketones, ur: NEGATIVE
Leukocytes,Ua: NEGATIVE
Nitrite: NEGATIVE
Protein, ur: NEGATIVE
Specific Gravity, Urine: 1.008 (ref 1.001–1.035)
pH: 6.5 (ref 5.0–8.0)

## 2020-10-19 LAB — COMPLETE METABOLIC PANEL WITH GFR
AG Ratio: 1.8 (calc) (ref 1.0–2.5)
ALT: 19 U/L (ref 6–29)
AST: 17 U/L (ref 10–35)
Albumin: 4.5 g/dL (ref 3.6–5.1)
Alkaline phosphatase (APISO): 62 U/L (ref 37–153)
BUN: 15 mg/dL (ref 7–25)
CO2: 28 mmol/L (ref 20–32)
Calcium: 9.9 mg/dL (ref 8.6–10.4)
Chloride: 106 mmol/L (ref 98–110)
Creat: 0.75 mg/dL (ref 0.50–1.05)
GFR, Est African American: 102 mL/min/{1.73_m2} (ref >=60)
GFR, Est Non African American: 88 mL/min/{1.73_m2} (ref >=60)
Globulin: 2.5 g/dL (calc) (ref 1.9–3.7)
Glucose, Bld: 102 mg/dL — ABNORMAL HIGH (ref 65–99)
Potassium: 4.3 mmol/L (ref 3.5–5.3)
Sodium: 142 mmol/L (ref 135–146)
Total Bilirubin: 0.4 mg/dL (ref 0.2–1.2)
Total Protein: 7 g/dL (ref 6.1–8.1)

## 2020-10-19 LAB — CBC WITH DIFFERENTIAL/PLATELET
Absolute Monocytes: 410 cells/uL (ref 200–950)
Basophils Absolute: 57 cells/uL (ref 0–200)
Basophils Relative: 1 %
Eosinophils Absolute: 171 cells/uL (ref 15–500)
Eosinophils Relative: 3 %
HCT: 39.2 % (ref 35.0–45.0)
Hemoglobin: 13.4 g/dL (ref 11.7–15.5)
Lymphs Abs: 1522 cells/uL (ref 850–3900)
MCH: 30.9 pg (ref 27.0–33.0)
MCHC: 34.2 g/dL (ref 32.0–36.0)
MCV: 90.5 fL (ref 80.0–100.0)
MPV: 10.3 fL (ref 7.5–12.5)
Monocytes Relative: 7.2 %
Neutro Abs: 3540 cells/uL (ref 1500–7800)
Neutrophils Relative %: 62.1 %
Platelets: 304 10*3/uL (ref 140–400)
RBC: 4.33 10*6/uL (ref 3.80–5.10)
RDW: 12.4 % (ref 11.0–15.0)
Total Lymphocyte: 26.7 %
WBC: 5.7 10*3/uL (ref 3.8–10.8)

## 2020-10-19 LAB — HEMOGLOBIN A1C
Hgb A1c MFr Bld: 5.7 % of total Hgb — ABNORMAL HIGH (ref <5.7)
Mean Plasma Glucose: 117 mg/dL
eAG (mmol/L): 6.5 mmol/L

## 2020-10-19 LAB — LIPID PANEL
Cholesterol: 233 mg/dL — ABNORMAL HIGH (ref <200)
HDL: 69 mg/dL (ref >=50)
LDL Cholesterol (Calc): 141 mg/dL (calc) — ABNORMAL HIGH
Non-HDL Cholesterol (Calc): 164 mg/dL (calc) — ABNORMAL HIGH (ref <130)
Total CHOL/HDL Ratio: 3.4 (calc) (ref <5.0)
Triglycerides: 110 mg/dL (ref <150)

## 2020-10-19 LAB — MICROALBUMIN / CREATININE URINE RATIO
Creatinine, Urine: 40 mg/dL (ref 20–275)
Microalb Creat Ratio: 5 mcg/mg creat (ref <30)
Microalb, Ur: 0.2 mg/dL

## 2020-10-19 LAB — TSH: TSH: 1.42 mIU/L (ref 0.40–4.50)

## 2020-10-19 LAB — VITAMIN D 25 HYDROXY (VIT D DEFICIENCY, FRACTURES): Vit D, 25-Hydroxy: 56 ng/mL (ref 30–100)

## 2020-10-19 LAB — MAGNESIUM: Magnesium: 2.3 mg/dL (ref 1.5–2.5)

## 2020-10-23 ENCOUNTER — Ambulatory Visit: Payer: 59

## 2020-11-02 ENCOUNTER — Other Ambulatory Visit: Payer: Self-pay

## 2020-11-02 ENCOUNTER — Ambulatory Visit
Admission: RE | Admit: 2020-11-02 | Discharge: 2020-11-02 | Disposition: A | Discharge status: Home / Self Care | Payer: Self-pay | Source: Ambulatory Visit | Attending: Internal Medicine | Admitting: Internal Medicine

## 2020-11-02 ENCOUNTER — Telehealth: Payer: Self-pay

## 2020-11-02 DIAGNOSIS — Z1231 Encounter for screening mammogram for malignant neoplasm of breast: Secondary | ICD-10-CM

## 2020-11-02 NOTE — Telephone Encounter (Signed)
Pt was called because she left  message for Dr. Melford Aase to send a RX over to the pharmacy for a shingles vaccine for her and her husband. Pr and her husband was called X3 times to epalin that no RX is needed. No one answered the phone so VM was left for her to call the office,

## 2020-11-03 ENCOUNTER — Encounter: Payer: Self-pay | Admitting: Gastroenterology

## 2020-12-19 ENCOUNTER — Other Ambulatory Visit: Payer: Self-pay | Admitting: Adult Health

## 2021-01-21 ENCOUNTER — Encounter: Payer: Self-pay | Admitting: Gastroenterology

## 2021-01-21 ENCOUNTER — Ambulatory Visit (AMBULATORY_SURGERY_CENTER): Payer: No Typology Code available for payment source

## 2021-01-21 VITALS — Ht 66.0 in | Wt 197.0 lb

## 2021-01-21 DIAGNOSIS — Z86010 Personal history of colonic polyps: Secondary | ICD-10-CM

## 2021-01-21 MED ORDER — PEG-KCL-NACL-NASULF-NA ASC-C 100 G PO SOLR: 1.0000 | Freq: Once | ORAL | 0 refills | Status: AC

## 2021-01-21 NOTE — Progress Notes (Signed)
No egg or soy allergy known to patient  No issues known to pt with past sedation with any surgeries or procedures Patient denies ever being told they had issues or difficulty with intubation  No FH of Malignant Hyperthermia Pt is not on diet pills Pt is not on  home 02  Pt is not on blood thinners  Pt denies issues with constipation  No A fib or A flutter  EMMI video to pt or via [place] 19 guidelines implemented in PV today with Pt and RN   Pt is fully vaccinated  for [employer]  Coupon given to pt in PV today , Code to Pharmacy and  NO PA's for preps discussed with pt In PV today  Discussed with pt there will be an out-of-pocket cost for prep and that varies from $0 to 70 +  dollars   Due to the COVID-19 pandemic we are asking patients to follow certain guidelines.  Pt aware of COVID protocols and [facility] guidelines

## 2021-01-25 ENCOUNTER — Telehealth: Payer: Self-pay | Admitting: Gastroenterology

## 2021-01-25 NOTE — Telephone Encounter (Signed)
Inbound call from patient's husband requesting a call please in regards to a coupon for the prep medication.

## 2021-01-25 NOTE — Telephone Encounter (Signed)
Called husband Zenia Resides- no answer- Voice mail states husbands name- LM they should be receiving a packet in the mail with a coupon by the end of the week , PV 9-23- takes 5-6 days to receive from the day of the PV OR he can come to 3rd floor and pick up a coupon- will have her name on the envelope   [facility]

## 2021-02-04 ENCOUNTER — Other Ambulatory Visit: Payer: Self-pay

## 2021-02-04 ENCOUNTER — Ambulatory Visit (AMBULATORY_SURGERY_CENTER): Payer: No Typology Code available for payment source | Admitting: Gastroenterology

## 2021-02-04 ENCOUNTER — Encounter: Payer: Self-pay | Admitting: Gastroenterology

## 2021-02-04 VITALS — BP 129/76 | HR 61 | Temp 98.4°F | Resp 16 | Ht 66.0 in | Wt 197.0 lb

## 2021-02-04 DIAGNOSIS — D122 Benign neoplasm of ascending colon: Secondary | ICD-10-CM

## 2021-02-04 DIAGNOSIS — Z86010 Personal history of colonic polyps: Secondary | ICD-10-CM | POA: Diagnosis not present

## 2021-02-04 DIAGNOSIS — D125 Benign neoplasm of sigmoid colon: Secondary | ICD-10-CM

## 2021-02-04 DIAGNOSIS — D123 Benign neoplasm of transverse colon: Secondary | ICD-10-CM | POA: Diagnosis not present

## 2021-02-04 MED ORDER — SODIUM CHLORIDE 0.9 % IV SOLN: 500.0000 mL | Freq: Once | INTRAVENOUS | Status: DC

## 2021-02-04 NOTE — Op Note (Signed)
[place] Patient Name: Samantha Duran Procedure Date: 02/04/2021 9:34 AM MRN: [id number] Endoscopist: Ladene Artist , MD Age: 60 Referring MD:  Date of Birth: Mar 25, 1961 Gender: Female Account #: [id number] Procedure:                Colonoscopy Indications:              Surveillance: Personal history of adenomatous                            polyps on last colonoscopy > 5 years ago Medicines:                Monitored Anesthesia Care Procedure:                Pre-Anesthesia Assessment:                           - Prior to the procedure, a History and Physical                            was performed, and patient medications and                            allergies were reviewed. The patient's tolerance of                            previous anesthesia was also reviewed. The risks                            and benefits of the procedure and the sedation                            options and risks were discussed with the patient.                            All questions were answered, and informed consent                            was obtained. Prior Anticoagulants: The patient has                            taken no previous anticoagulant or antiplatelet                            agents. ASA Grade Assessment: II - A patient with                            mild systemic disease. After reviewing the risks                            and benefits, the patient was deemed in                            satisfactory condition to undergo the procedure.  After obtaining informed consent, the colonoscope                            was passed under direct vision. Throughout the                            procedure, the patient's blood pressure, pulse, and                            oxygen saturations were monitored continuously. The                            Olympus CF-HQ190L ([id number]) Colonoscope was                            introduced through the anus and  advanced to the the                            cecum, identified by appendiceal orifice and                            ileocecal valve. The ileocecal valve, appendiceal                            orifice, and rectum were photographed. The quality                            of the bowel preparation was good. The colonoscopy                            was performed without difficulty. The patient                            tolerated the procedure well. Scope In: 9:42:00 AM Scope Out: 9:59:05 AM Scope Withdrawal Time: 0 hours 14 minutes 9 seconds  Total Procedure Duration: 0 hours 17 minutes 5 seconds  Findings:                 The perianal and digital rectal examinations were                            normal.                           A 4 mm polyp was found in the ascending colon. The                            polyp was sessile. The polyp was removed with a                            cold biopsy forceps. Resection and retrieval were                            complete.  Two sessile polyps were found in the sigmoid colon                            and transverse colon. The polyps were 7 to 9 mm in                            size. These polyps were removed with a cold snare.                            Resection and retrieval were complete.                           The exam was otherwise without abnormality on                            direct and retroflexion views. Complications:            No immediate complications. Estimated blood loss:                            None. Estimated Blood Loss:     Estimated blood loss: none. Impression:               - One 4 mm polyp in the ascending colon, removed                            with a cold biopsy forceps. Resected and retrieved.                           - Two 7 to 9 mm polyps in the sigmoid colon and in                            the transverse colon, removed with a cold snare.                            Resected  and retrieved.                           - The examination was otherwise normal on direct                            and retroflexion views. Recommendation:           - Repeat colonoscopy after studies are complete for                            surveillance based on pathology results.                           - Patient has a contact number available for                            emergencies. The signs and symptoms of potential  delayed complications were discussed with the                            patient. Return to normal activities tomorrow.                            Written discharge instructions were provided to the                            patient.                           - Resume previous diet.                           - Continue present medications.                           - Await pathology results. Ladene Artist, MD 02/04/2021 10:03:41 AM This report has been signed electronically.

## 2021-02-04 NOTE — Progress Notes (Signed)
Report to PACU, RN, vss, BBS= Clear.  

## 2021-02-04 NOTE — Progress Notes (Signed)
CW-VS Pt's states no medical or surgical changes since previsit or office visit.

## 2021-02-04 NOTE — Patient Instructions (Signed)
Handout given on polyps. Await pathology results.  Repeat colonoscopy for surveillance will be determined based off of pathology results.   YOU HAD AN ENDOSCOPIC PROCEDURE TODAY AT [facility]:   Refer to the procedure report that was given to you for any specific questions about what was found during the examination.  If the procedure report does not answer your questions, please call your gastroenterologist to clarify.  If you requested that your care partner not be given the details of your procedure findings, then the procedure report has been included in a sealed envelope for you to review at your convenience later.  YOU SHOULD EXPECT: Some feelings of bloating in the abdomen. Passage of more gas than usual.  Walking can help get rid of the air that was put into your GI tract during the procedure and reduce the bloating. If you had a lower endoscopy (such as a colonoscopy or flexible sigmoidoscopy) you may notice spotting of blood in your stool or on the toilet paper. If you underwent a bowel prep for your procedure, you may not have a normal bowel movement for a few days.  Please Note:  You might notice some irritation and congestion in your nose or some drainage.  This is from the oxygen used during your procedure.  There is no need for concern and it should clear up in a day or so.  SYMPTOMS TO REPORT IMMEDIATELY:  Following lower endoscopy (colonoscopy or flexible sigmoidoscopy):  Excessive amounts of blood in the stool  Significant tenderness or worsening of abdominal pains  Swelling of the abdomen that is new, acute  Fever of 100F or higher  For urgent or emergent issues, a gastroenterologist can be reached at any hour by calling [phone]. Do not use MyChart messaging for urgent concerns.    DIET:  We do recommend a small meal at first, but then you may proceed to your regular diet.  Drink plenty of fluids but you should avoid alcoholic beverages for 24  hours.  ACTIVITY:  You should plan to take it easy for the rest of today and you should NOT DRIVE or use heavy machinery until tomorrow (because of the sedation medicines used during the test).    FOLLOW UP: Our staff will call the number listed on your records 48-72 hours following your procedure to check on you and address any questions or concerns that you may have regarding the information given to you following your procedure. If we do not reach you, we will leave a message.  We will attempt to reach you two times.  During this call, we will ask if you have developed any symptoms of COVID 19. If you develop any symptoms (ie: fever, flu-like symptoms, shortness of breath, cough etc.) before then, please call [phone].  If you test positive for Covid 19 in the 2 weeks post procedure, please call and report this information to [place].    If any biopsies were taken you will be contacted by phone or by letter within the next 1-3 weeks.  Please call us at [phone] if you have not heard about the biopsies in 3 weeks.    SIGNATURES/CONFIDENTIALITY: You and/or your care partner have signed paperwork which will be entered into your electronic medical record.  These signatures attest to the fact that that the information above on your After Visit Summary has been reviewed and is understood.  Full responsibility of the confidentiality of this discharge information lies with you and/or  your care-partner.

## 2021-02-04 NOTE — Progress Notes (Signed)
Called to room to assist during endoscopic procedure.  Patient ID and intended procedure confirmed with present staff. Received instructions for my participation in the procedure from the performing physician.  

## 2021-02-04 NOTE — Progress Notes (Signed)
History & Physical  Primary Care Physician:  Unk Pinto, MD Primary Gastroenterologist: Lucio Edward, MD  CHIEF COMPLAINT:  Personal history of colon polyps   HPI: Samantha Duran is a 60 y.o. female with a history of adenomatous colon polyps in 2015 presenting for surveillance colonoscopy.   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 Diona Foley, [place]  06/2010   secondary to cancer    Prior to Admission medications   Medication Sig Start Date End Date Taking? Authorizing Provider  ADVAIR DISKUS 100-50 MCG/ACT AEPB Use  1 inhalation   2 x /day (every 12 hours)  for Asthma 12/19/20  Yes Unk Pinto, MD  aspirin 81 MG tablet Take 81 mg by mouth daily.   Yes [provider]  b complex vitamins tablet Take 1 tablet by mouth daily.   Yes [provider]  Calcium Carbonate-Vitamin D (CALCIUM + D PO) Take by mouth.   Yes [provider]  Cholecalciferol (VITAMIN D) 125 MCG (5000 UT) CAPS Take 1,000 Units by mouth 2 (two) times daily. Takes 10,000 units daily   Yes [provider]  Coenzyme Q10 (CO Q 10 PO) Take 200 mg by mouth.   Yes [provider]  levothyroxine (SYNTHROID) 125 MCG tablet TAKE 1 TABLET BY MOUTH DAILY ON AN EMPTY STOMACH WITH ONLY WATER FOR 30 MINUTES & NO ANTACID MEDS, CALCIUM OR MAGNESIUM FOR 4 HOURS & AVOID BIOTIN 05/10/20  Yes Liane Comber, NP  Magnesium 250 MG TABS Take by mouth daily.   Yes [provider]  Multiple Vitamin (MULTIVITAMIN) tablet Take 1 tablet by mouth daily.   Yes [provider]  TURMERIC PO Take by mouth daily.   Yes [provider]  Zinc 50 MG TABS Take by mouth daily.   Yes [provider]  albuterol (VENTOLIN HFA) 108 (90 Base) MCG/ACT inhaler Inhale 2 puffs into the lungs every 4 (four) hours as needed for wheezing or shortness of breath. 06/16/19   Liane Comber, NP  Ascorbic Acid (VITAMIN C) 500 MG CHEW Chew by mouth daily.    [provider]    Current Outpatient Medications  Medication Sig Dispense Refill   ADVAIR DISKUS 100-50 MCG/ACT AEPB Use  1 inhalation   2 x /day (every 12 hours)  for Asthma 180 each 3   aspirin 81 MG tablet Take 81 mg by mouth daily.     b complex vitamins tablet Take 1 tablet by mouth daily.     Calcium Carbonate-Vitamin D (CALCIUM + D PO) Take by mouth.     Cholecalciferol (VITAMIN D) 125 MCG (5000 UT) CAPS Take 1,000 Units by mouth 2 (two) times daily. Takes 10,000 units daily     Coenzyme Q10 (CO Q 10 PO) Take 200 mg by mouth.  levothyroxine (SYNTHROID) 125 MCG tablet TAKE 1 TABLET BY MOUTH DAILY ON AN EMPTY STOMACH WITH ONLY WATER FOR 30 MINUTES & NO ANTACID MEDS, CALCIUM OR MAGNESIUM FOR 4 HOURS & AVOID BIOTIN 90 tablet 3   Magnesium 250 MG TABS Take by mouth daily.     Multiple Vitamin (MULTIVITAMIN) tablet Take 1 tablet by mouth daily.     TURMERIC PO Take by mouth daily.     Zinc 50 MG TABS Take by mouth daily.     albuterol (VENTOLIN HFA) 108 (90 Base) MCG/ACT inhaler Inhale 2 puffs into the lungs every 4 (four) hours as needed for wheezing or shortness of breath. 18 g 1   Ascorbic Acid (VITAMIN C) 500 MG CHEW Chew by mouth daily.     Current Facility-Administered Medications  Medication Dose Route Frequency Provider Last Rate Last Admin   0.9 %  sodium chloride infusion  500 mL Intravenous Once Ladene Artist, MD        Allergies as of 02/04/2021   (No Known Allergies)     Family History  Problem Relation Age of Onset   Stomach cancer Maternal Aunt    Diabetes Paternal Grandmother    Stroke Paternal Grandfather    Colon cancer Neg Hx    Pancreatic cancer Neg Hx    Rectal cancer Neg Hx    Breast cancer Neg Hx    Esophageal cancer Neg Hx     Social History   Socioeconomic History   Marital status: Married    Spouse name: Not on file   Number of children: 2   Years of education: Not on file   Highest education level: Not on file  Occupational History   Not on file  Tobacco Use   Smoking status: Former    Packs/day: 1.00    Years: 5.00    Pack years: 5.00    Types: Cigarettes    Quit date: 05/20/1987    Years since quitting: 33.7   Smokeless tobacco: Never   Tobacco comments:    States smoked every other day or so  Vaping Use   Vaping Use: Never used  Substance and Sexual Activity   Alcohol use: No   Drug use: Never   Sexual activity: Yes    Partners: Male    Birth control/protection: None, Post-menopausal  Other Topics Concern   Not on file  Social History Narrative   Not on file   Social Determinants of Health   Financial Resource Strain: Not on file  Food Insecurity: Not on file  Transportation Needs: Not on file  Physical Activity: Not on file  Stress: Not on file  Social Connections: Not on file  Intimate Partner Violence: Not on file    Review of Systems:  All systems reviewed an negative except where noted in HPI.  Gen: Denies any fever, chills, sweats, anorexia, fatigue, weakness, malaise, weight loss, and sleep disorder CV: Denies chest pain, angina, palpitations, syncope, orthopnea, PND, peripheral edema, and claudication. Resp: Denies dyspnea at rest, dyspnea with exercise, cough, sputum, wheezing, coughing up blood, and pleurisy. GI: Denies vomiting blood, jaundice, and fecal incontinence.   Denies dysphagia or odynophagia. GU : Denies urinary burning, blood in urine, urinary frequency, urinary hesitancy,  nocturnal urination, and urinary incontinence. MS: Denies joint pain, limitation of movement, and swelling, stiffness, low back pain, extremity pain. Denies muscle weakness, cramps, atrophy.  Derm: Denies rash, itching, dry skin, hives, moles, warts, or unhealing ulcers.  Psych: Denies depression, anxiety, memory  loss, suicidal ideation, hallucinations, paranoia, and confusion. Heme: Denies bruising, bleeding, and enlarged lymph nodes. Neuro:  Denies any headaches, dizziness, paresthesias. Endo:  Denies any problems with DM, thyroid, adrenal function.   Physical Exam: General:  Alert, well-developed, in NAD Head:  Normocephalic and atraumatic. Eyes:  Sclera clear, no icterus.   Conjunctiva pink. Ears:  Normal auditory acuity. Mouth:  No deformity or lesions.  Neck:  Supple; no masses . Lungs:  Clear throughout to auscultation.   No wheezes, crackles, or rhonchi. No acute distress. Heart:  Regular rate and rhythm; no murmurs. Abdomen:  Soft, nondistended, nontender. No masses, hepatomegaly. No obvious masses.  Normal bowel .    Rectal:  Deferred   Msk:  Symmetrical without gross deformities.. Pulses:  Normal pulses noted. Extremities:  Without edema. Neurologic:  Alert and  oriented x4;  grossly normal neurologically. Skin:  Intact without significant lesions or rashes. Cervical Nodes:  No significant cervical adenopathy. Psych:  Alert and cooperative. Normal mood and affect.   Impression / Plan:  Personal history of adenomatous colon polyps in 2015 for surveillance colonoscopy.   This patient is appropriate for endoscopic procedures in the ambulatory setting.    Samantha Duran. [place] Plan  02/04/2021, 9:37 AM See Shea Evans,  GI, to contact our on call provider

## 2021-02-08 ENCOUNTER — Telehealth: Payer: Self-pay

## 2021-02-08 ENCOUNTER — Telehealth: Payer: Self-pay | Admitting: *Deleted

## 2021-02-08 NOTE — Telephone Encounter (Signed)
Patient returned call.States she is doing fine and is back at work.

## 2021-02-08 NOTE — Telephone Encounter (Signed)
No answer, left message to call back later today, B.Consuela Widener RN. 

## 2021-02-08 NOTE — Telephone Encounter (Signed)
  Follow up Call-  Call back number 02/04/2021  Post procedure Call Back phone  # [phone]  Permission to leave phone message Yes  Some recent data might be hidden     Patient questions:  Do you have a fever, pain , or abdominal swelling? No. Pain Score  0 *  Have you tolerated food without any problems? Yes.    Have you been able to return to your normal activities? Yes.    Do you have any questions about your discharge instructions: Diet   No. Medications  No. Follow up visit  No.  Do you have questions or concerns about your Care? No.  Actions: * If pain score is 4 or above: No action needed, pain <4.   Have you developed a fever since your procedure? no  2.   Have you had an respiratory symptoms (SOB or cough) since your procedure? no  3.   Have you tested positive for COVID 19 since your procedure no  4.   Have you had any family members/close contacts diagnosed with the COVID 19 since your procedure?  no   If yes to any of these questions please route to Joylene John, RN and Joella Prince, RN

## 2021-02-22 ENCOUNTER — Encounter: Payer: Self-pay | Admitting: Gastroenterology

## 2021-04-08 ENCOUNTER — Other Ambulatory Visit: Payer: Self-pay

## 2021-04-08 ENCOUNTER — Emergency Department (HOSPITAL_BASED_OUTPATIENT_CLINIC_OR_DEPARTMENT_OTHER)
Admission: EM | Admit: 2021-04-08 | Discharge: 2021-04-08 | Disposition: A | Discharge status: Home / Self Care | Payer: No Typology Code available for payment source | Attending: Emergency Medicine | Admitting: Emergency Medicine

## 2021-04-08 ENCOUNTER — Encounter (HOSPITAL_BASED_OUTPATIENT_CLINIC_OR_DEPARTMENT_OTHER): Payer: Self-pay | Admitting: Obstetrics and Gynecology

## 2021-04-08 DIAGNOSIS — Z85850 Personal history of malignant neoplasm of thyroid: Secondary | ICD-10-CM | POA: Diagnosis not present

## 2021-04-08 DIAGNOSIS — Z87891 Personal history of nicotine dependence: Secondary | ICD-10-CM | POA: Diagnosis not present

## 2021-04-08 DIAGNOSIS — I10 Essential (primary) hypertension: Secondary | ICD-10-CM | POA: Insufficient documentation

## 2021-04-08 DIAGNOSIS — M549 Dorsalgia, unspecified: Secondary | ICD-10-CM | POA: Diagnosis present

## 2021-04-08 DIAGNOSIS — J45909 Unspecified asthma, uncomplicated: Secondary | ICD-10-CM | POA: Insufficient documentation

## 2021-04-08 DIAGNOSIS — Z79899 Other long term (current) drug therapy: Secondary | ICD-10-CM | POA: Insufficient documentation

## 2021-04-08 DIAGNOSIS — B029 Zoster without complications: Secondary | ICD-10-CM | POA: Diagnosis not present

## 2021-04-08 DIAGNOSIS — Z7982 Long term (current) use of aspirin: Secondary | ICD-10-CM | POA: Insufficient documentation

## 2021-04-08 DIAGNOSIS — E039 Hypothyroidism, unspecified: Secondary | ICD-10-CM | POA: Insufficient documentation

## 2021-04-08 LAB — URINALYSIS, ROUTINE W REFLEX MICROSCOPIC
Bilirubin Urine: NEGATIVE
Glucose, UA: NEGATIVE mg/dL
Hgb urine dipstick: NEGATIVE
Ketones, ur: NEGATIVE mg/dL
Leukocytes,Ua: NEGATIVE
Nitrite: NEGATIVE
Protein, ur: NEGATIVE mg/dL
Specific Gravity, Urine: <1.005 — ABNORMAL LOW (ref 1.005–1.030)
pH: 6.5 (ref 5.0–8.0)

## 2021-04-08 MED ORDER — MORPHINE SULFATE 15 MG PO TABS: 7.5000 mg | ORAL_TABLET | ORAL | 0 refills | Status: DC | PRN

## 2021-04-08 MED ORDER — PREDNISONE 20 MG PO TABS: ORAL_TABLET | ORAL | 0 refills | Status: DC

## 2021-04-08 MED ORDER — VALACYCLOVIR HCL 1 G PO TABS: 1000.0000 mg | ORAL_TABLET | Freq: Three times a day (TID) | ORAL | 0 refills | Status: DC

## 2021-04-08 NOTE — ED Triage Notes (Signed)
Patient reports to the ER for right sided flank pain that radiates into her lower abdomen. Patient reports it started yesterday at work and it has increased in intensity.

## 2021-04-08 NOTE — ED Provider Notes (Signed)
[facility] EMERGENCY DEPT Provider Note   CSN: [id number] Arrival date & time: 04/08/21  7510     History Chief Complaint  Patient presents with   Flank Pain    Samantha Duran is a 60 y.o. female.  60 yo F with a chief complaints of right back pain.  Has been going on since yesterday.  Gotten progressively worse.  No trauma no fevers or chills.  She denies any worse with eating.  Not specifically worse with movement palpation or twisting.  She denies any urinary symptoms.  Denies history of kidney stones.  The history is provided by the patient.  Flank Pain This is a new problem. The current episode started yesterday. The problem occurs constantly. The problem has been gradually worsening. Pertinent negatives include no chest pain, no headaches and no shortness of breath. Nothing aggravates the symptoms. Nothing relieves the symptoms. She has tried nothing for the symptoms. The treatment provided no relief.      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 Diona Foley, 90s   POLYPECTOMY     TOTAL THYROIDECTOMY  06/2010   secondary to cancer     OB History     Gravida  3   Para  2   Term   2   Preterm  0   AB  1   Living  2      SAB  1   IAB  0   Ectopic  0   Multiple  0   Live Births  2           Family History  Problem Relation Age of Onset   Stomach cancer Maternal Aunt    Diabetes Paternal Grandmother    Stroke Paternal Grandfather    Colon cancer Neg Hx    Pancreatic cancer Neg Hx    Rectal cancer Neg Hx    Breast cancer Neg Hx    Esophageal cancer Neg Hx     Social History   Tobacco Use   Smoking status: Former    Packs/day: 1.00    Years: 5.00    Pack years: 5.00    Types: Cigarettes    Quit date: 05/20/1987    Years since quitting: 33.9   Smokeless tobacco: Never   Tobacco comments:    States smoked every other day or so  Vaping Use   Vaping Use: Never used  Substance Use Topics   Alcohol use: No   Drug use: Never    Home Medications Prior to  Admission medications   Medication Sig Start Date End Date Taking? Authorizing Provider  morphine (MSIR) 15 MG tablet Take 0.5 tablets (7.5 mg total) by mouth every 4 (four) hours as needed for severe pain. 04/08/21  Yes Deno Etienne, DO  predniSONE (DELTASONE) 20 MG tablet 3 tabs po daily x 3 days, then 2 tabs x 3 days, then 1.5 tabs x 3 days, then 1 tab x 3 days, then 0.5 tabs x 3 days 04/08/21  Yes Deno Etienne, DO  valACYclovir (VALTREX) 1000 MG tablet Take 1 tablet (1,000 mg total) by mouth 3 (three) times daily. 04/08/21  Yes Deno Etienne, DO  ADVAIR DISKUS 100-50 MCG/ACT AEPB Use  1 inhalation   2 x /day (every 12 hours)  for Asthma 12/19/20   Unk Pinto, MD  albuterol (VENTOLIN HFA) 108 (90 Base) MCG/ACT inhaler Inhale 2 puffs into the lungs every 4 (four) hours as needed for wheezing or shortness of breath. 06/16/19   Liane Comber, NP  Ascorbic Acid (VITAMIN C) 500 MG CHEW Chew by mouth daily.    [provider]  aspirin 81 MG tablet Take 81 mg by mouth daily.    [provider]  b complex vitamins tablet Take 1 tablet by mouth daily.    [provider]   Calcium Carbonate-Vitamin D (CALCIUM + D PO) Take by mouth.    [provider]  Cholecalciferol (VITAMIN D) 125 MCG (5000 UT) CAPS Take 1,000 Units by mouth 2 (two) times daily. Takes 10,000 units daily    [provider]  Coenzyme Q10 (CO Q 10 PO) Take 200 mg by mouth.    [provider]  levothyroxine (SYNTHROID) 125 MCG tablet TAKE 1 TABLET BY MOUTH DAILY ON AN EMPTY STOMACH WITH ONLY WATER FOR 30 MINUTES & NO ANTACID MEDS, CALCIUM OR MAGNESIUM FOR 4 HOURS & AVOID BIOTIN 05/10/20   Liane Comber, NP  Magnesium 250 MG TABS Take by mouth daily.    [provider]  Multiple Vitamin (MULTIVITAMIN) tablet Take 1 tablet by mouth daily.    [provider]  TURMERIC PO Take by mouth daily.    [provider]  Zinc 50 MG TABS Take by mouth daily.    [provider]    Allergies    Patient has no known allergies.  Review of Systems   Review of Systems  Constitutional:  Negative for chills and fever.  HENT:  Negative for congestion and rhinorrhea.   Eyes:  Negative for redness and visual disturbance.  Respiratory:  Negative for shortness of breath and wheezing.   Cardiovascular:  Negative for chest pain and palpitations.  Gastrointestinal:  Negative for nausea and vomiting.  Genitourinary:  Positive for flank pain. Negative for dysuria and urgency.  Musculoskeletal:  Negative for arthralgias and myalgias.  Skin:  Negative for pallor and wound.  Neurological:  Negative for dizziness and headaches.   Physical Exam Updated Vital Signs BP (!) 153/78 (BP Location: Left Arm)   Pulse 74   Temp 97.7 F (36.5 C)   Resp 16   Ht 5\' 6"  (1.676 m)   Wt 89.8 kg   SpO2 98%   BMI 31.96 kg/m   Physical Exam Vitals and nursing note reviewed.  Constitutional:      General: She is not in acute distress.    Appearance: She is well-developed. She is not diaphoretic.  HENT:     Head: Normocephalic and atraumatic.  Eyes:     Pupils:  Pupils  are equal, round, and reactive to light.  Cardiovascular:     Rate and Rhythm: Normal rate and regular rhythm.     Heart sounds: No murmur heard.   No friction rub. No gallop.  Pulmonary:     Effort: Pulmonary effort is normal.     Breath sounds: No wheezing or rales.  Abdominal:     General: There is no distension.     Palpations: Abdomen is soft.     Tenderness: There is no abdominal tenderness.  Musculoskeletal:        General: No tenderness.     Cervical back: Normal range of motion and neck supple.  Skin:    General: Skin is warm and dry.     Findings: Rash present.     Comments: Small cluster of vesicular lesions on the right abdomen along the anterior axillary line in the area of discomfort.  Neurological:     Mental Status: She is alert and oriented to person, place, and time.  Psychiatric:        Behavior: Behavior normal.    ED Results / Procedures / Treatments   Labs (all labs ordered are listed, but only abnormal results are displayed) Labs Reviewed  URINALYSIS, ROUTINE W REFLEX MICROSCOPIC - Abnormal; Notable for the following components:      Result Value   Color, Urine COLORLESS (*)    Specific Gravity, Urine <1.005 (*)    All other components within normal limits    EKG None  Radiology No results found.  Procedures Procedures   Medications Ordered in ED Medications - No data to display  ED Course  I have reviewed the triage vital signs and the nursing notes.  Pertinent labs & imaging results that were available during my care of the patient were reviewed by me and considered in my medical decision making (see chart for details).    MDM Rules/Calculators/A&P                           60 yo F with a chief complaints of right flank pain.  Going on since yesterday progressively worsening.  Clinically the patient has shingles.  Will start on antivirals and steroids.  UA without infection or hematuria.  PCP follow-up.  10:02 AM:  I have  discussed the diagnosis/risks/treatment options with the patient and believe the pt to be eligible for discharge home to follow-up with PCP. We also discussed returning to the ED immediately if new or worsening sx occur. We discussed the sx which are most concerning (e.g., sudden worsening pain, fever, inability to tolerate by mouth, disseminated rash) that necessitate immediate return. Medications administered to the patient during their visit and any new prescriptions provided to the patient are listed below.  Medications given during this visit Medications - No data to display   The patient appears reasonably screen and/or stabilized for discharge and I doubt any other medical condition or other [facility] requiring further screening, evaluation, or treatment in the ED at this time prior to discharge.   Final Clinical Impression(s) / ED Diagnoses Final diagnoses:  Herpes zoster without complication    Rx / DC Orders ED Discharge Orders          Ordered    valACYclovir (VALTREX) 1000 MG tablet  3 times daily        04/08/21 0959    predniSONE (DELTASONE) 20 MG tablet        04/08/21 0959  morphine (MSIR) 15 MG tablet  Every 4 hours PRN        04/08/21 Naomi, [place], DO 04/08/21 1003

## 2021-04-08 NOTE — Discharge Instructions (Addendum)
Please follow-up with your family doctor for this.  Please return for a fever or if this rash spreads all over your body.  Take the steroids and antiviral medicines as prescribed.  Take tylenol 1000mg (2 extra strength) four times a day.   Then take the pain medicine if you feel like you need it. Narcotics do not help with the pain, they only make you care about it less.  You can become addicted to this, people may break into your house to steal it.  It will constipate you.  If you drive under the influence of this medicine you can get a DUI.

## 2021-05-04 ENCOUNTER — Ambulatory Visit: Payer: PRIVATE HEALTH INSURANCE | Admitting: Adult Health

## 2021-05-24 ENCOUNTER — Other Ambulatory Visit: Payer: Self-pay | Admitting: Adult Health

## 2021-08-16 ENCOUNTER — Telehealth: Payer: Self-pay | Admitting: Adult Health

## 2021-08-16 ENCOUNTER — Other Ambulatory Visit: Payer: Self-pay | Admitting: Adult Health

## 2021-08-16 ENCOUNTER — Encounter: Payer: Self-pay | Admitting: Adult Health

## 2021-08-16 MED ORDER — ADVAIR DISKUS 100-50 MCG/ACT IN AEPB: INHALATION_SPRAY | RESPIRATORY_TRACT | 0 refills | Status: DC

## 2021-08-16 NOTE — Telephone Encounter (Signed)
Patient gets her Advair Diskus 100/50 through mail order. She didn't realize she was out and it will take them 3 days to get them to her. Do we have any samples? If not, can you send in a temporary supply to her local pharmacy to get her through until her mail order arrives? -e welch ?

## 2021-10-14 DIAGNOSIS — R7309 Other abnormal glucose: Secondary | ICD-10-CM | POA: Insufficient documentation

## 2021-10-14 NOTE — Progress Notes (Unsigned)
Complete Physical  Assessment and Plan:  Encounter for Annual Physical Exam with abnormal findings Due annually  Health Maintenance reviewed Healthy lifestyle reviewed and goals set *** Schedule mammogram  Check about shingrix   Essential hypertension - continue medications, DASH diet, exercise and monitor at home. Call if greater than 130/80.  -     CBC with Differential/Platelet -     CMP/GFR -     TSH -     Urinalysis, Routine w reflex microscopic -     Microalbumin / creatinine urine ratio -     EKG  Hypothyroidism, unspecified type Continue medications the same, reminded to take on an empty stomach 30-7mns before food. STOP biotin  -     TSH  Hyperlipidemia, unspecified hyperlipidemia type -continue medications, check lipids, decrease fatty foods, increase activity.  - LDL goal <100; discussed medication if 130+ - she is on new weight loss/lifestyle program, would like to pursue for 6 months and defer medications - follow up 6 months  -     Lipid panel  Uncomplicated asthma, unspecified asthma severity, unspecified whether persistent Controlled Refill Advair ***, albuterol PRN, try 1 puff 15 min prior to exercise  Gastroesophageal reflux disease with esophagitis Recently well controlled by lifestyle, PRN tums; monitor   Recurrent major depressive disorder, in full remission ([place] - Remains in remission off of medications stress management techniques discussed, increase water, good sleep hygiene discussed, increase exercise, and increase veggies.   Vitamin D deficiency -     VITAMIN D 25 Hydroxy (Vit-D Deficiency, Fractures)  Medication management -     Magnesium  No orders of the defined types were placed in this encounter.   Discussed med's effects and SE's. Screening labs and tests as requested with regular follow-up as recommended. Over 40 minutes of exam, counseling, chart review, and complex, high level critical decision making was performed this visit.    Future Appointments  Date Time Provider [place] 10/18/2021  9:00 AM CLiane Comber NP [facility] None     HPI  61y.o. female  presents for a complete physical. She has Hypertension; Hyperlipidemia; GERD (gastroesophageal reflux disease); Asthma; Depression; Vitamin D deficiency; Hypothyroidism; Obesity (BMI 30.0-34.9); and History of adenomatous polyp of colon on their problem list.   She is married, 2 daughters, 3 grandsons, close by and sees frequently.  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 No recent flares.   GERD well controlled with lifestyle modification, rare tums.   She has hx of major depression, in remission off of medications.  PHQ-9 of 0 today. Reports sleeping well.   BMI is There is no height or weight on file to calculate BMI., she has been working on diet and exercise. She would like to lose 8 lb, doing intermittent fasting.  Counting steps for goal of [id number]  Water; 70-80 fluid ounces No alcohol, 12-14 ounces of coffee daily, rare soda, switching to green tea Sleeps well  Trying to get down to <180 lb  Wt Readings from Last 3 Encounters:  04/08/21 198 lb (89.8 kg)  02/04/21 197 lb (89.4 kg)  01/21/21 197 lb (89.4 kg)   Today their BP is   She does workout. She denies chest pain, shortness of breath, dizziness.   She is not on cholesterol medication and denies myalgias. Her cholesterol is not at goal, working on diet this year and prefers to avoid adding  medication if possible.  10 year ASCVD from 10/18/2020 data was 2.9% 10 year risk, 39% lifetime risk.  The cholesterol last visit was:   Lab Results  Component Value Date   CHOL 233 (H) 10/18/2020   HDL 69 10/18/2020   LDLCALC 141 (H) 10/18/2020   TRIG 110 10/18/2020   CHOLHDL 3.4 10/18/2020    Last A1C in the office was:  Lab Results  Component Value Date   HGBA1C 5.7 (H) 10/18/2020    Patient is on Vitamin D supplement, taking 10000 IU   Lab Results  Component Value Date   VD25OH 22 10/18/2020     She is on thyroid medication for history of thyroid cancer s/p thyroidectomy in 2012, was following with Dr. Chalmers Cater but was released. Her medication was not changed last visit, 125 mcg daily, takes first thing with water, nothing else for 1 hour. She is on a biotin supplement.  Lab Results  Component Value Date   TSH 1.42 10/18/2020        Current Medications:  Current Outpatient Medications on File Prior to Visit  Medication Sig Dispense Refill   ADVAIR DISKUS 100-50 MCG/ACT AEPB Use  1 inhalation   2 x /day (every 12 hours)  for Asthma 60 each 0   albuterol (VENTOLIN HFA) 108 (90 Base) MCG/ACT inhaler Inhale 2 puffs into the lungs every 4 (four) hours as needed for wheezing or shortness of breath. 18 g 1   Ascorbic Acid (VITAMIN C) 500 MG CHEW Chew by mouth daily.     aspirin 81 MG tablet Take 81 mg by mouth daily.     b complex vitamins tablet Take 1 tablet by mouth daily.     Calcium Carbonate-Vitamin D (CALCIUM + D PO) Take by mouth.     Cholecalciferol (VITAMIN D) 125 MCG (5000 UT) CAPS Take 1,000 Units by mouth 2 (two) times daily. Takes 10,000 units daily     Coenzyme Q10 (CO Q 10 PO) Take 200 mg by mouth.     levothyroxine (SYNTHROID) 125 MCG tablet TAKE 1 TAB DAILY ON EMPTY  STOMACH WITH WATER ONLY FOR 30 MIN. NO ANTACIDS,  CALCIUM, MAGNESIUM FOR 4  HOURS. AVOID BIOTIN 90 tablet 3   Magnesium 250 MG TABS Take by mouth daily.     morphine (MSIR) 15 MG tablet Take 0.5 tablets (7.5 mg total) by mouth every 4 (four) hours as needed for severe pain. 5 tablet 0   Multiple Vitamin (MULTIVITAMIN) tablet Take 1 tablet by mouth daily.     predniSONE (DELTASONE) 20 MG tablet 3 tabs po daily x 3 days, then 2 tabs x 3 days, then 1.5 tabs x 3 days, then 1 tab x 3 days, then 0.5 tabs x 3 days 27 tablet 0   TURMERIC PO Take by mouth daily.     valACYclovir (VALTREX) 1000 MG  tablet Take 1 tablet (1,000 mg total) by mouth 3 (three) times daily. 21 tablet 0   Zinc 50 MG TABS Take by mouth daily.     No current facility-administered medications on file prior to visit.   Allergies:  No Known Allergies   Medical History:  She has Hypertension; Hyperlipidemia; GERD (gastroesophageal reflux disease); Asthma; Depression; Vitamin D deficiency; Hypothyroidism; Obesity (BMI 30.0-34.9); and History of adenomatous polyp of colon on their problem list.   Health Maintenance:   Immunization History  Administered Date(s) Administered   Influenza Inj Mdck Quad With Preservative 03/25/2017, 02/06/2018, 02/03/2019   Influenza,inj,quad, With Preservative 02/09/2016  Influenza-Unspecified 02/10/2012, 01/29/2014   PFIZER(Purple Top)SARS-COV-2 Vaccination 08/11/2019, 09/01/2019   PPD Test 03/23/2014   Pneumococcal-Unspecified 03/05/2008   Tdap 02/17/2007, 05/03/2016   Health Maintenance  Topic Date Due   Zoster Vaccines- Shingrix (1 of 2) Never done   COVID-19 Vaccine (3 - Pfizer risk series) 09/29/2019   PAP SMEAR-Modifier  05/03/2021   INFLUENZA VACCINE  11/29/2021   MAMMOGRAM  11/03/2022   COLONOSCOPY (Pts 45-56yr Insurance coverage will need to be confirmed)  02/04/2026   TETANUS/TDAP  05/03/2026   Hepatitis C Screening  Completed   HIV Screening  Completed   HPV VACCINES  Aged Out   Shingrix: Check with insurance  No LMP recorded. (Menstrual status: Perimenopausal). Pap: 2018 never abnormal pap neg HPV will repeat 5 years, DUE  MGM: 11/02/2020 ***  Colonoscopy: 02/04/2021, Dr. SFuller Planadenomas, 5 year recall   Last Dental Exam:  Dr. MRandol Kern last 2022, goes q637mast Eye Exam:  Dr. McEinar Giplast 2021  Patient Care Team: McUnk PintoMD as PCP - General (Internal Medicine) McWebb LawsODAllendales Referring Physician (Optometry) StLadene ArtistMD as Consulting Physician (Gastroenterology)  Surgical History:  She has a past surgical history that  includes Appendectomy; Total thyroidectomy (06/2010); Mandible surgery (Right); Colonoscopy (01/2014); and Polypectomy. Family History:  Herfamily history includes Diabetes in her paternal grandmother; Stomach cancer in her maternal aunt; Stroke in her paternal grandfather. Social History:  She reports that she quit smoking about 34 years ago. Her smoking use included cigarettes. She has a 5.00 pack-year smoking history. She has never used smokeless tobacco. She reports that she does not drink alcohol and does not use drugs.  Review of Systems: Review of Systems  Constitutional: Negative.  Negative for malaise/fatigue and weight loss.  HENT: Negative.  Negative for hearing loss and tinnitus.   Eyes: Negative.  Negative for blurred vision and double vision.  Respiratory: Negative.  Negative for cough, shortness of breath and wheezing.   Cardiovascular: Negative.  Negative for chest pain, palpitations, orthopnea, claudication and leg swelling.  Gastrointestinal: Negative.  Negative for abdominal pain, blood in stool, constipation, diarrhea, heartburn, melena, nausea and vomiting.  Genitourinary: Negative.   Musculoskeletal: Negative.  Negative for joint pain and myalgias.  Skin: Negative.  Negative for rash.  Neurological: Negative.  Negative for dizziness, tingling, sensory change, weakness and headaches.  Endo/Heme/Allergies: Negative.  Negative for polydipsia.  Psychiatric/Behavioral: Negative.    All other systems reviewed and are negative.   Physical Exam: Estimated body mass index is 31.96 kg/m as calculated from the following:   Height as of 04/08/21: '5\' 6"'$  (1.676 m).   Weight as of 04/08/21: 198 lb (89.8 kg). There were no vitals taken for this visit. General Appearance: Well nourished, in no apparent distress.  Eyes: PERRLA, EOMs, conjunctiva no swelling or erythema Sinuses: No Frontal/maxillary tenderness  ENT/Mouth: Ext aud canals clear, normal light reflex with TMs without  erythema, bulging. Good dentition. No erythema, swelling, or exudate on post pharynx. Tonsils not swollen or erythematous. Hearing normal.  Neck: Supple, well healed surgical scar at base of neck, no lumps or nodules. No bruits  Respiratory: Respiratory effort normal, BS equal bilaterally without rales, rhonchi, wheezing or stridor.  Cardio: RRR without murmurs, without rubs or gallops. Brisk peripheral pulses without edema.  Chest: symmetric, with normal excursions and percussion.  Breasts: *** Abdomen: Soft, nontender, no guarding, rebound, hernias, masses, or organomegaly.  Lymphatics: Non tender without lymphadenopathy.  Musculoskeletal: Full ROM all peripheral extremities,5/5 strength, and  normal gait.  Skin: Warm, dry without rashes, lesions, ecchymosis. Neuro: Cranial nerves intact, reflexes equal bilaterally. Normal muscle tone, no cerebellar symptoms. Sensation intact.  Psych: Awake and oriented X 3, normal affect, Insight and Judgment appropriate.  Genitourinary: ***   EKG: NSR, NSCPT  Izora Ribas, NP-C 12:29 PM [facility]

## 2021-10-18 ENCOUNTER — Encounter: Payer: Self-pay | Admitting: Adult Health

## 2021-10-18 ENCOUNTER — Ambulatory Visit (INDEPENDENT_AMBULATORY_CARE_PROVIDER_SITE_OTHER): Payer: No Typology Code available for payment source | Admitting: Adult Health

## 2021-10-18 VITALS — BP 122/84 | HR 85 | Temp 97.1°F | Ht 66.5 in | Wt 203.8 lb

## 2021-10-18 DIAGNOSIS — Z13220 Encounter for screening for lipoid disorders: Secondary | ICD-10-CM

## 2021-10-18 DIAGNOSIS — Z130 Encounter for screening for diseases of the blood and blood-forming organs and certain disorders involving the immune mechanism: Secondary | ICD-10-CM | POA: Diagnosis not present

## 2021-10-18 DIAGNOSIS — Z1389 Encounter for screening for other disorder: Secondary | ICD-10-CM

## 2021-10-18 DIAGNOSIS — J4530 Mild persistent asthma, uncomplicated: Secondary | ICD-10-CM

## 2021-10-18 DIAGNOSIS — Z131 Encounter for screening for diabetes mellitus: Secondary | ICD-10-CM

## 2021-10-18 DIAGNOSIS — I10 Essential (primary) hypertension: Secondary | ICD-10-CM

## 2021-10-18 DIAGNOSIS — Z79899 Other long term (current) drug therapy: Secondary | ICD-10-CM

## 2021-10-18 DIAGNOSIS — Z136 Encounter for screening for cardiovascular disorders: Secondary | ICD-10-CM | POA: Diagnosis not present

## 2021-10-18 DIAGNOSIS — Z0000 Encounter for general adult medical examination without abnormal findings: Secondary | ICD-10-CM

## 2021-10-18 DIAGNOSIS — E559 Vitamin D deficiency, unspecified: Secondary | ICD-10-CM

## 2021-10-18 DIAGNOSIS — E039 Hypothyroidism, unspecified: Secondary | ICD-10-CM

## 2021-10-18 DIAGNOSIS — Z1231 Encounter for screening mammogram for malignant neoplasm of breast: Secondary | ICD-10-CM

## 2021-10-18 DIAGNOSIS — Z0001 Encounter for general adult medical examination with abnormal findings: Secondary | ICD-10-CM

## 2021-10-18 DIAGNOSIS — K2100 Gastro-esophageal reflux disease with esophagitis, without bleeding: Secondary | ICD-10-CM

## 2021-10-18 DIAGNOSIS — Z86010 Personal history of colonic polyps: Secondary | ICD-10-CM

## 2021-10-18 DIAGNOSIS — E785 Hyperlipidemia, unspecified: Secondary | ICD-10-CM

## 2021-10-18 DIAGNOSIS — E538 Deficiency of other specified B group vitamins: Secondary | ICD-10-CM

## 2021-10-18 DIAGNOSIS — F3342 Major depressive disorder, recurrent, in full remission: Secondary | ICD-10-CM

## 2021-10-18 DIAGNOSIS — E669 Obesity, unspecified: Secondary | ICD-10-CM

## 2021-10-18 DIAGNOSIS — R7309 Other abnormal glucose: Secondary | ICD-10-CM

## 2021-10-18 DIAGNOSIS — Z124 Encounter for screening for malignant neoplasm of cervix: Secondary | ICD-10-CM

## 2021-10-18 NOTE — Patient Instructions (Addendum)
Ms. Faughnan , Thank you for taking time to come for your Annual Wellness Visit. I appreciate your ongoing commitment to your health goals. Please review the following plan we discussed and let me know if I can assist you in the future.   These are the goals we discussed:  Goals      Weight (lb) < 180 lb (81.6 kg)        This is a list of the screening recommended for you and due dates:  Health Maintenance  Topic Date Due   Zoster (Shingles) Vaccine (1 of 2) Never done   Pap Smear  05/03/2021   COVID-19 Vaccine (3 - Pfizer risk series) 11/03/2021*   Mammogram  11/02/2021   Flu Shot  11/29/2021   Colon Cancer Screening  02/04/2026   Tetanus Vaccine  05/03/2026   Hepatitis C Screening: USPSTF Recommendation to screen - Ages 18-79 yo.  Completed   HIV Screening  Completed   HPV Vaccine  Aged Out  *Topic was postponed. The date shown is not the original due date.     Know what a healthy weight is for you (roughly BMI <25) and aim to maintain this  Aim for 7+ servings of fruits and vegetables daily  65-80+ fluid ounces of water or unsweet tea for healthy kidneys  Limit to max 1 drink of alcohol per day; avoid smoking/tobacco  Limit animal fats in diet for cholesterol and heart health - choose grass fed whenever available  Avoid highly processed foods, and foods high in saturated/trans fats  Aim for low stress - take time to unwind and care for your mental health  Aim for 150 min of moderate intensity exercise weekly for heart health, and weights twice weekly for bone health  Aim for 7-9 hours of sleep daily     High-Fiber Eating Plan Fiber, also called dietary fiber, is a type of carbohydrate. It is found foods such as fruits, vegetables, whole grains, and beans. A high-fiber diet can have many health benefits. Your health care provider may recommend a high-fiber diet to help: Prevent constipation. Fiber can make your bowel movements more regular. Lower your  cholesterol. Relieve the following conditions: Inflammation of veins in the anus (hemorrhoids). Inflammation of specific areas of the digestive tract (uncomplicated diverticulosis). A problem of the large intestine, also called the colon, that sometimes causes pain and diarrhea (irritable bowel syndrome, or IBS). Prevent overeating as part of a weight-loss plan. Prevent heart disease, type 2 diabetes, and certain cancers. What are tips for following this plan? Reading food labels  Check the nutrition facts label on food products for the amount of dietary fiber. Choose foods that have 5 grams of fiber or more per serving. The goals for recommended daily fiber intake include: Men (age 57 or younger): 34-38 g. Men (over age 69): 28-34 g. Women (age 30 or younger): 25-28 g. Women (over age 3): 22-25 g. Your daily fiber goal is _____________ g. Shopping Choose whole fruits and vegetables instead of processed forms, such as apple juice or applesauce. Choose a wide variety of high-fiber foods such as avocados, lentils, oats, and kidney beans. Read the nutrition facts label of the foods you choose. Be aware of foods with added fiber. These foods often have high sugar and sodium amounts per serving. Cooking Use whole-grain flour for baking and cooking. Cook with brown rice instead of white rice. Meal planning Start the day with a breakfast that is high in fiber, such as a cereal  that contains 5 g of fiber or more per serving. Eat breads and cereals that are made with whole-grain flour instead of refined flour or white flour. Eat brown rice, bulgur wheat, or millet instead of white rice. Use beans in place of meat in soups, salads, and pasta dishes. Be sure that half of the grains you eat each day are whole grains. General information You can get the recommended daily intake of dietary fiber by: Eating a variety of fruits, vegetables, grains, nuts, and beans. Taking a fiber supplement if you  are not able to take in enough fiber in your diet. It is better to get fiber through food than from a supplement. Gradually increase how much fiber you consume. If you increase your intake of dietary fiber too quickly, you may have bloating, cramping, or gas. Drink plenty of water to help you digest fiber. Choose high-fiber snacks, such as berries, raw vegetables, nuts, and popcorn. What foods should I eat? Fruits Berries. Pears. Apples. Oranges. Avocado. Prunes and raisins. Dried figs. Vegetables Sweet potatoes. Spinach. Kale. Artichokes. Cabbage. Broccoli. Cauliflower. Green peas. Carrots. Squash. Grains Whole-grain breads. Multigrain cereal. Oats and oatmeal. Brown rice. Barley. Bulgur wheat. [place]. Quinoa. Bran muffins. Popcorn. Rye wafer crackers. Meats and other proteins Navy beans, kidney beans, and pinto beans. Soybeans. Split peas. Lentils. Nuts and seeds. Dairy Fiber-fortified yogurt. Beverages Fiber-fortified soy milk. Fiber-fortified orange juice. Other foods Fiber bars. The items listed above may not be a complete list of recommended foods and beverages. Contact a dietitian for more information. What foods should I avoid? Fruits Fruit juice. Cooked, strained fruit. Vegetables Fried potatoes. Canned vegetables. Well-cooked vegetables. Grains White bread. Pasta made with refined flour. White rice. Meats and other proteins Fatty cuts of meat. Fried chicken or fried fish. Dairy Milk. Yogurt. Cream cheese. Sour cream. Fats and oils Butters. Beverages Soft drinks. Other foods Cakes and pastries. The items listed above may not be a complete list of foods and beverages to avoid. Talk with your dietitian about what choices are best for you. Summary Fiber is a type of carbohydrate. It is found in foods such as fruits, vegetables, whole grains, and beans. A high-fiber diet has many benefits. It can help to prevent constipation, lower blood cholesterol, aid weight loss, and  reduce your risk of heart disease, diabetes, and certain cancers. Increase your intake of fiber gradually. Increasing fiber too quickly may cause cramping, bloating, and gas. Drink plenty of water while you increase the amount of fiber you consume. The best sources of fiber include whole fruits and vegetables, whole grains, nuts, seeds, and beans. This information is not intended to replace advice given to you by your health care provider. Make sure you discuss any questions you have with your health care provider. Document Revised: 08/21/2019 Document Reviewed: 08/21/2019 Elsevier Patient Education  [employer] Content in Foods Protein is a necessary nutrient in any diet. It helps build and repair muscles, bones, and skin. Depending on your overall health, you may need more or less protein in your diet. You are encouraged to eat a variety of protein foods to ensure that you get all the essential nutrients that are found in different protein foods. Talk with your health care provider or dietitian about how much protein you need each day and which sources of protein are best for you. Protein is especially important for: Repairing and making cells and tissues. Fighting infection. Providing energy. Growth and development. See the following  list for the protein content of some common foods. What are tips for getting more protein in your diet? Try to replace processed carbohydrates with high-quality protein. Snack on nuts and seeds instead of chips. Replace baked desserts with [place] yogurt. Eat protein foods from both plant and animal sources. Replace red meat with seafood. Add beans and peas to salads, soups, and side dishes. Include a protein food with each meal and snack. Reading food labels You can find the amount of protein in a food item by looking at the nutrition facts label. Use the total grams listed to help you reach your daily goal. What foods are high in  protein?  High-protein foods contain 4 grams (g) or more of protein per serving. They include: Grains Quinoa (cooked) -- 1 cup (185 g) has 8 g of protein. Whole wheat pasta (cooked) -- 1 cup (140 g) has 6 g of protein. Meat Beef, ground sirloin (cooked) -- 3 oz (85 g) has 24 g of protein. Chicken breast, boneless and skinless (cooked) -- 3 oz (85 g) has 25 g of protein. Egg -- 1 egg has 6 g of protein. Fish, filet (cooked) -- 1 oz (28 g) has 6-7 g of protein. Lamb (cooked) -- 3 oz (85 g) has 24 g of protein. Pork tenderloin (cooked) -- 3 oz (85 g) has 23 g of protein. Tuna (canned in water) -- 3 oz (85 g) has 20 g of protein. Dairy Cottage cheese --  cup (114 g) has 13.4 g of protein. Milk -- 1 cup (237 mL) has 8 g of protein. Cheese (hard) -- 1 oz (28 g) has 7 g of protein. Yogurt, regular -- 6 oz (170 g) has 8 g of protein. Greek yogurt -- 6 oz (200 g) has 18 g protein. Plant protein Garbanzo beans (canned or cooked) --  cup (130 g) has 6-7 g of protein. Kidney beans (canned or cooked) --  cup (130 g) has 6-7 g of protein. Nuts (peanuts, pistachios, almonds) -- 1 oz (28 g) has 6 g of protein. Peanut butter -- 1 oz (32 g) has 7-8 g of protein. Pumpkin seeds -- 1 oz (28 g) has 8.5 g of protein. Soybeans (roasted) -- 1 oz (28 g) has 8 g of protein. Soybeans (cooked) --  cup (90 g) has 11 g of protein. Soy milk -- 1 cup (250 mL) has 5-10 g of protein. Soy or vegetable patty -- 1 patty has 11 g of protein. Sunflower seeds -- 1 oz (28 g) has 5.5 g of protein. Buckwheat -- 1 oz (33 g) has 4.3 g of protein. Tofu (firm) --  cup (124 g) has 20 g of protein. Tempeh --  cup (83 g) has 16 g of protein. The items listed above may not be a complete list of foods high in protein. Actual amounts of protein may differ depending on processing. Contact a dietitian for more information. What foods are low in protein?  Low-protein foods contain 3 grams (g) or less of protein per serving. They  include: Fruits Fruit or vegetable juice --  cup (125 mL) has 1 g of protein. Vegetables Beets (raw or cooked) --  cup (68 g) has 1.5 g of protein. Broccoli (raw or cooked) --  cup (44 g) has 2 g of protein. Collard greens (raw or cooked) --  cup (42 g) has 2 g of protein. Green beans (raw or cooked) --  cup (83 g) has 1 g of protein. Green peas (canned) --  cup (80 g) has 3.5 g of protein. Potato (baked with skin) -- 1 medium potato (173 g) has 3 g of protein. Spinach (cooked) --  cup (90 g) has 3 g of protein. Squash (cooked) --  cup (90 g) has 1.5 g of protein. Avocado -- 1 cup (146 g) has 2.7 g of protein. Grains Bran cereal --  cup (30 g) has 2-3 g of protein. Bread -- 1 slice has 2.5 g of protein. Corn (fresh or cooked) --  cup (77 g) has 2 g of protein. Flour tortilla -- One 6-inch (15 cm) tortilla has 2.5 g of protein. Muffins -- 1 small muffin (2 oz or 57 g) has 3 g of protein. Oatmeal (cooked) --  cup (40 g) has 3 g of protein. Rice (cooked) --  cup (79 g) has 2.5-3.5 g of protein. Dairy Cream cheese -- 1 oz (29 g) has 2 g of protein. Creamer (half-and-half) -- 1 oz (29 mL) has 1 g of protein. Frozen yogurt --  cup (72 g) has 3 g of protein. Sour cream --  cup (75 g) has 2.5 g of protein. The items listed above may not be a complete list of foods low in protein. Actual amounts of protein may differ depending on processing. Contact a dietitian for more information. Summary Protein is a nutrient that your body needs for growth and development, repairing and making cells and tissues, fighting infection, and providing energy. Protein is in both plant and animal foods. Some of these foods have more protein than others. Depending on your overall health, you may need more or less protein in your diet. Talk to your health care provider about how much protein you need. This information is not intended to replace advice given to you by your health care provider. Make sure  you discuss any questions you have with your health care provider. Document Revised: 03/22/2020 Document Reviewed: 03/22/2020 Elsevier Patient Education  [place].

## 2021-10-19 LAB — URINALYSIS, ROUTINE W REFLEX MICROSCOPIC
Bilirubin Urine: NEGATIVE
Glucose, UA: NEGATIVE
Hgb urine dipstick: NEGATIVE
Ketones, ur: NEGATIVE
Leukocytes,Ua: NEGATIVE
Nitrite: NEGATIVE
Protein, ur: NEGATIVE
Specific Gravity, Urine: 1.005 (ref 1.001–1.035)
pH: 7 (ref 5.0–8.0)

## 2021-10-19 LAB — COMPLETE METABOLIC PANEL WITH GFR
AG Ratio: 1.7 (calc) (ref 1.0–2.5)
ALT: 20 U/L (ref 6–29)
AST: 19 U/L (ref 10–35)
Albumin: 4.5 g/dL (ref 3.6–5.1)
Alkaline phosphatase (APISO): 69 U/L (ref 37–153)
BUN: 13 mg/dL (ref 7–25)
CO2: 25 mmol/L (ref 20–32)
Calcium: 9.6 mg/dL (ref 8.6–10.4)
Chloride: 105 mmol/L (ref 98–110)
Creat: 0.74 mg/dL (ref 0.50–1.03)
Globulin: 2.6 g/dL (calc) (ref 1.9–3.7)
Glucose, Bld: 97 mg/dL (ref 65–99)
Potassium: 4.1 mmol/L (ref 3.5–5.3)
Sodium: 141 mmol/L (ref 135–146)
Total Bilirubin: 0.4 mg/dL (ref 0.2–1.2)
Total Protein: 7.1 g/dL (ref 6.1–8.1)
eGFR: 93 mL/min/{1.73_m2} (ref >=60)

## 2021-10-19 LAB — LIPID PANEL
Cholesterol: 247 mg/dL — ABNORMAL HIGH (ref <200)
HDL: 78 mg/dL (ref >=50)
LDL Cholesterol (Calc): 145 mg/dL (calc) — ABNORMAL HIGH
Non-HDL Cholesterol (Calc): 169 mg/dL (calc) — ABNORMAL HIGH (ref <130)
Total CHOL/HDL Ratio: 3.2 (calc) (ref <5.0)
Triglycerides: 118 mg/dL (ref <150)

## 2021-10-19 LAB — CBC WITH DIFFERENTIAL/PLATELET
Absolute Monocytes: 454 cells/uL (ref 200–950)
Basophils Absolute: 50 cells/uL (ref 0–200)
Basophils Relative: 0.8 %
Eosinophils Absolute: 214 cells/uL (ref 15–500)
Eosinophils Relative: 3.4 %
HCT: 40.1 % (ref 35.0–45.0)
Hemoglobin: 13.7 g/dL (ref 11.7–15.5)
Lymphs Abs: 1682 cells/uL (ref 850–3900)
MCH: 31.6 pg (ref 27.0–33.0)
MCHC: 34.2 g/dL (ref 32.0–36.0)
MCV: 92.4 fL (ref 80.0–100.0)
MPV: 10.2 fL (ref 7.5–12.5)
Monocytes Relative: 7.2 %
Neutro Abs: 3900 cells/uL (ref 1500–7800)
Neutrophils Relative %: 61.9 %
Platelets: 313 10*3/uL (ref 140–400)
RBC: 4.34 10*6/uL (ref 3.80–5.10)
RDW: 12.8 % (ref 11.0–15.0)
Total Lymphocyte: 26.7 %
WBC: 6.3 10*3/uL (ref 3.8–10.8)

## 2021-10-19 LAB — VITAMIN B12: Vitamin B-12: 636 pg/mL (ref 200–1100)

## 2021-10-19 LAB — MICROALBUMIN / CREATININE URINE RATIO
Creatinine, Urine: 26 mg/dL (ref 20–275)
Microalb, Ur: <0.2 mg/dL

## 2021-10-19 LAB — MAGNESIUM: Magnesium: 2.3 mg/dL (ref 1.5–2.5)

## 2021-10-19 LAB — VITAMIN D 25 HYDROXY (VIT D DEFICIENCY, FRACTURES): Vit D, 25-Hydroxy: 49 ng/mL (ref 30–100)

## 2021-10-19 LAB — HEMOGLOBIN A1C
Hgb A1c MFr Bld: 5.6 % of total Hgb (ref <5.7)
Mean Plasma Glucose: 114 mg/dL
eAG (mmol/L): 6.3 mmol/L

## 2021-10-19 LAB — TSH: TSH: 2 mIU/L (ref 0.40–4.50)

## 2021-10-20 LAB — PAP, TP IMAGING W/ HPV RNA, RFLX HPV TYPE 16,18/45: HPV DNA High Risk: NOT DETECTED

## 2021-10-20 LAB — PAP, TP IMAGING, WNL RFLX HPV

## 2021-11-11 ENCOUNTER — Ambulatory Visit: Payer: No Typology Code available for payment source

## 2022-01-13 ENCOUNTER — Other Ambulatory Visit: Payer: Self-pay | Admitting: Internal Medicine

## 2022-04-20 ENCOUNTER — Ambulatory Visit: Payer: No Typology Code available for payment source | Admitting: Nurse Practitioner

## 2022-06-28 ENCOUNTER — Other Ambulatory Visit: Payer: Self-pay

## 2022-06-28 MED ORDER — LEVOTHYROXINE SODIUM 125 MCG PO TABS: ORAL_TABLET | ORAL | 3 refills | Status: DC

## 2022-07-25 ENCOUNTER — Encounter: Payer: Self-pay | Admitting: Internal Medicine

## 2022-07-25 ENCOUNTER — Ambulatory Visit (INDEPENDENT_AMBULATORY_CARE_PROVIDER_SITE_OTHER): Payer: No Typology Code available for payment source | Admitting: Internal Medicine

## 2022-07-25 VITALS — BP 112/70 | HR 81 | Temp 97.9°F | Ht 66.0 in | Wt 203.4 lb

## 2022-07-25 DIAGNOSIS — Q828 Other specified congenital malformations of skin: Secondary | ICD-10-CM

## 2022-07-25 DIAGNOSIS — F3342 Major depressive disorder, recurrent, in full remission: Secondary | ICD-10-CM

## 2022-07-25 NOTE — Progress Notes (Signed)
     Future Appointments  Date Time Provider Department  07/25/2022  3:30 PM Unk Pinto, MD [facility]  10/19/2022  9:00 AM Darrol Jump, NP [facility]    History of Present Illness:     Patient is a very nice 62 yo MWF presenting with concern of   inflamed skin tags.   Current Outpatient Medications on File Prior to Visit  Medication Sig   VITAMIN C 500 MG   daily.   aspirin 81 MG tablet Take  daily.   b complex vitamins tablet Take 1 tablet daily.   CALCIUM + D Take    VITAMIN D 125 MCG 5000 u  Takes 10,000 units daily   Coenzyme Q10  Take 200 mg .   ADVAIR DISKUS 100 Use 1 Inhalation  2 x /day (   levothyroxine 125 MCG tablet TAKE 1 TAB DAILY    Magnesium 250 MG TABS Take by mouth daily.   morphine (MSIR) 15 MG tablet Take 0.5 tablets every 4 (four) hours as needed for severe pain.   Multiple Vitamin  Take 1 tablet by mouth daily.   TURMERIC  Take by mouth daily.   Zinc 50 MG TABS Take by mouth daily.    No Known Allergies   Problem list She has Hypertension; Hyperlipidemia; GERD (gastroesophageal reflux disease); Asthma; Depression; Vitamin D deficiency; Hypothyroidism; Obesity (BMI 30.0-34.9); History of adenomatous polyp of colon; and Other abnormal glucose (prediabetes) on their problem list.   Observations/Objective:  BP 112/70   Pulse 81   Temp 97.9 F (36.6 C)   Ht 5\' 6"  (1.676 m)   Wt 203 lb 6.4 oz (92.3 kg)   SpO2 98%   BMI 32.83 kg/m   Skin focused exam - patient has # 3 skin tags  on extremities & chest locally anesthetized with Marcaine 0.5%  and sharply excised & then wound bases hyfrecated for hemostasis. Antibiotic ointment & sterile band aids applies & patient instructed ion post-op wound care.   Assessment and Plan:   1. Accessory skin tags   Follow Up Instructions:       I discussed the assessment and treatment plan with the patient. The patient was provided an opportunity to ask questions and all were answered. The patient agreed  with the plan and demonstrated an understanding of the instructions.       The patient was advised to call back or seek an in-person evaluation if the symptoms worsen or if the condition fails to improve as anticipated.    Kirtland Bouchard, MD

## 2022-07-30 DIAGNOSIS — F3342 Major depressive disorder, recurrent, in full remission: Secondary | ICD-10-CM | POA: Insufficient documentation

## 2022-08-23 IMAGING — MG MM DIGITAL SCREENING BILAT W/ TOMO AND CAD
8 series · 8 of 24 positions shown · non-contrast
Comparison: Previous exam(s).

CLINICAL DATA: Screening.

EXAM:
DIGITAL SCREENING BILATERAL MAMMOGRAM WITH TOMOSYNTHESIS AND CAD
TECHNIQUE: Bilateral screening digital craniocaudal and mediolateral oblique
mammograms were obtained. Bilateral screening digital breast
tomosynthesis was performed. The images were evaluated with
computer-aided detection.

[R CC synth-2D]
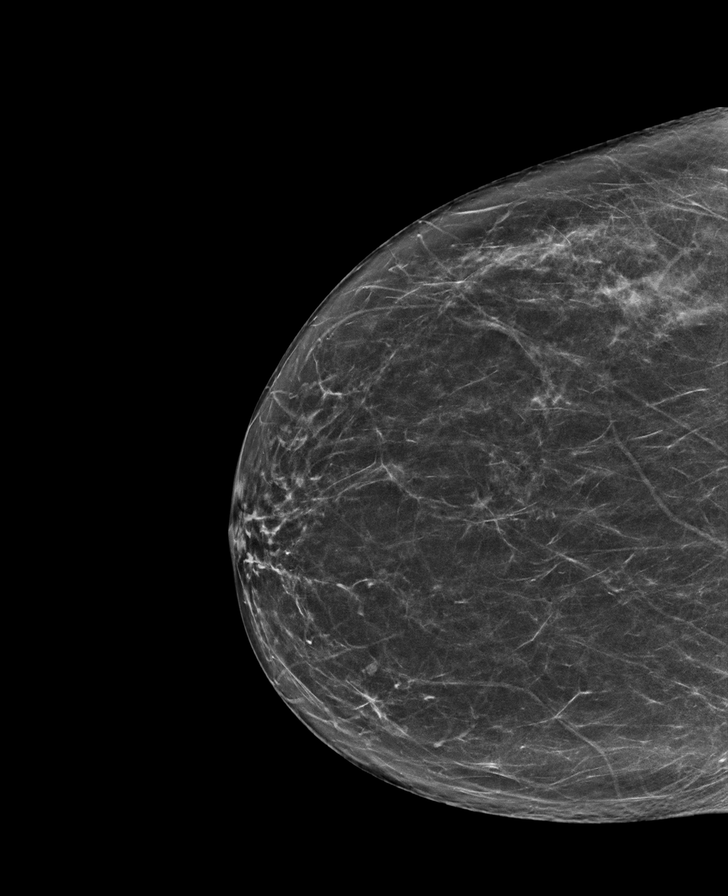

[L MLO synth-2D]
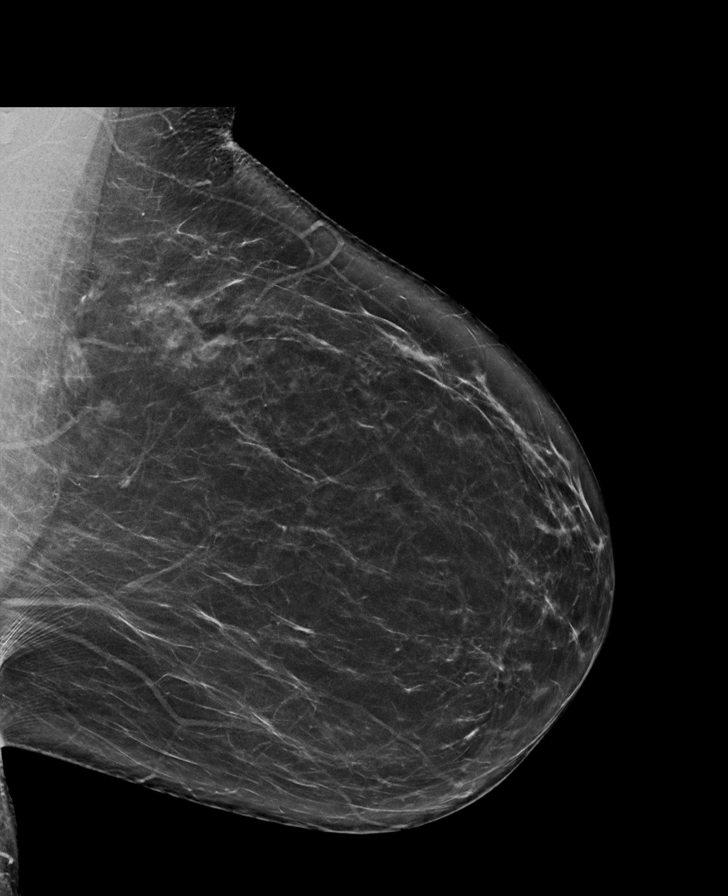

[R MLO synth-2D]
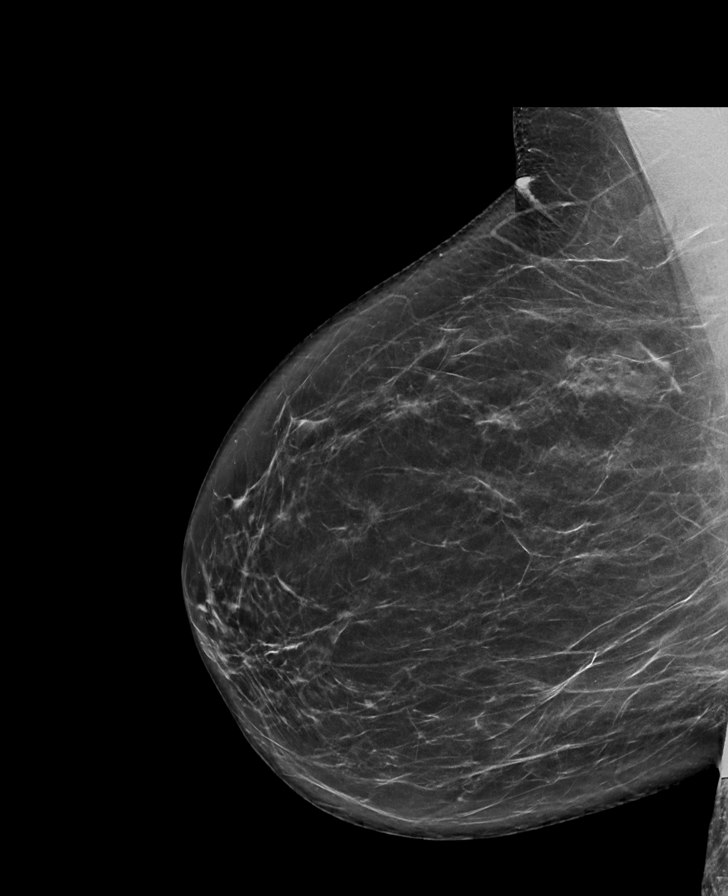

[L CC synth-2D]
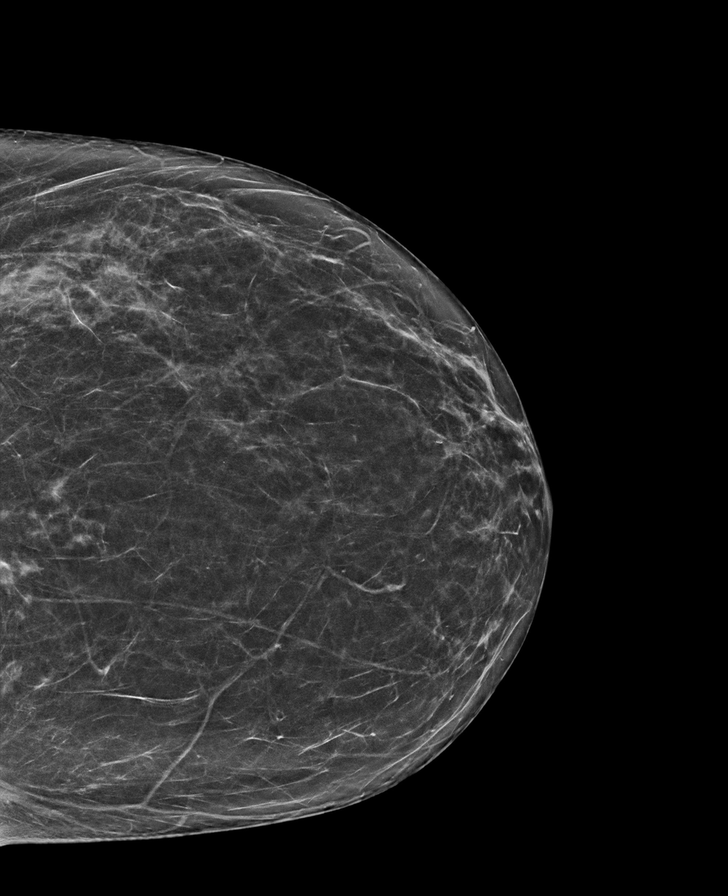

[R MLO tomo · tomo slice 39/77.0]
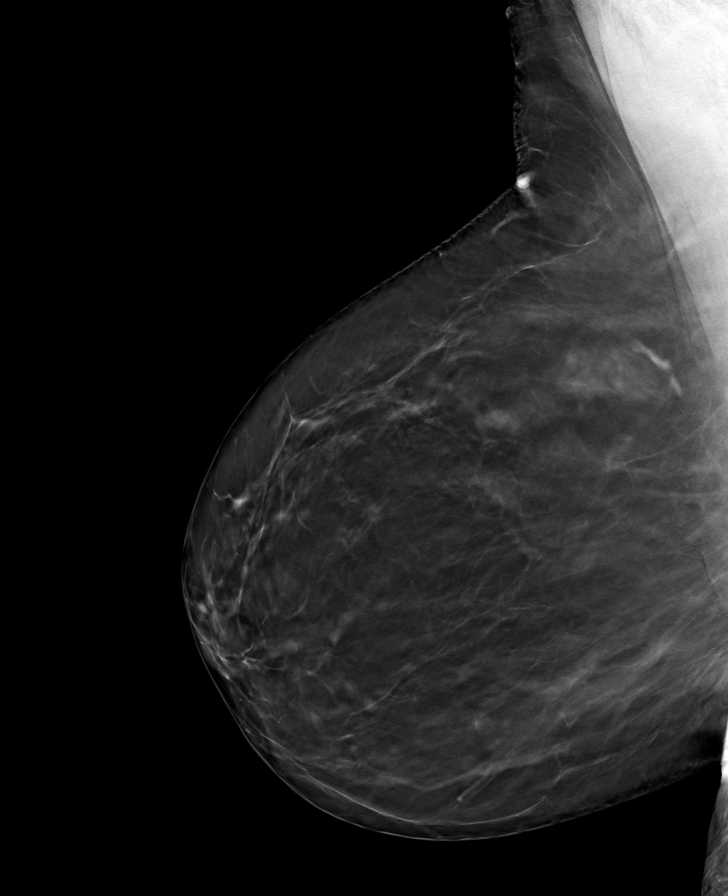

[L CC tomo · tomo slice 33/64.0]
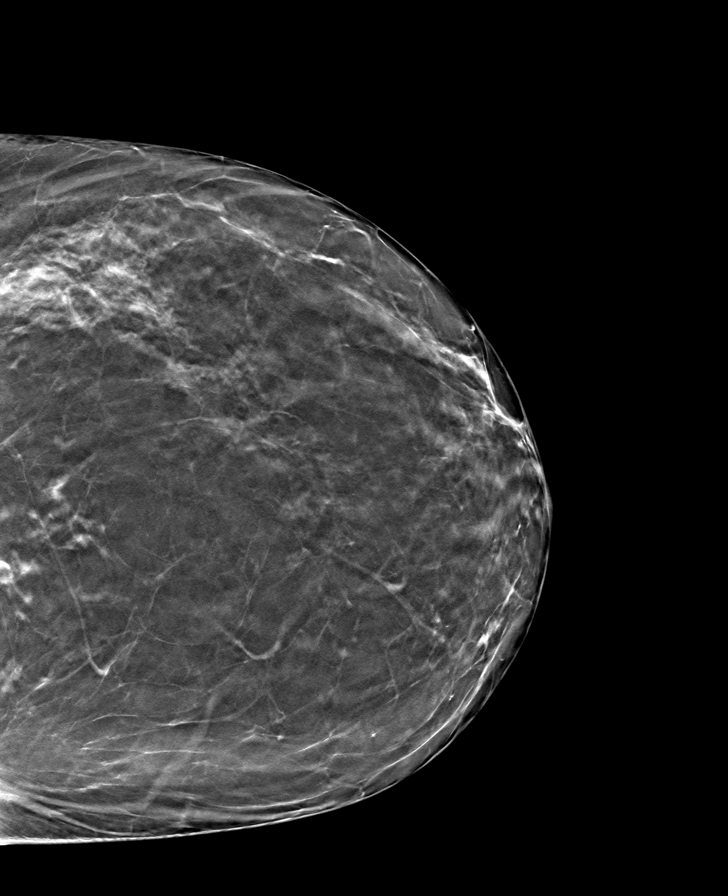

[R CC tomo · tomo slice 33/66.0]
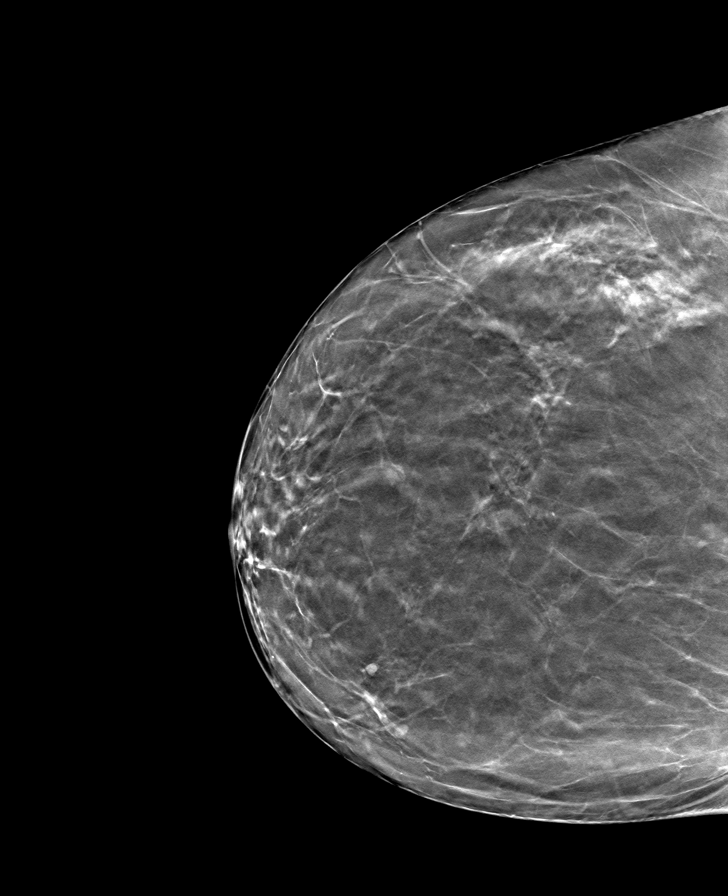

[L MLO tomo · tomo slice 41/81.0]
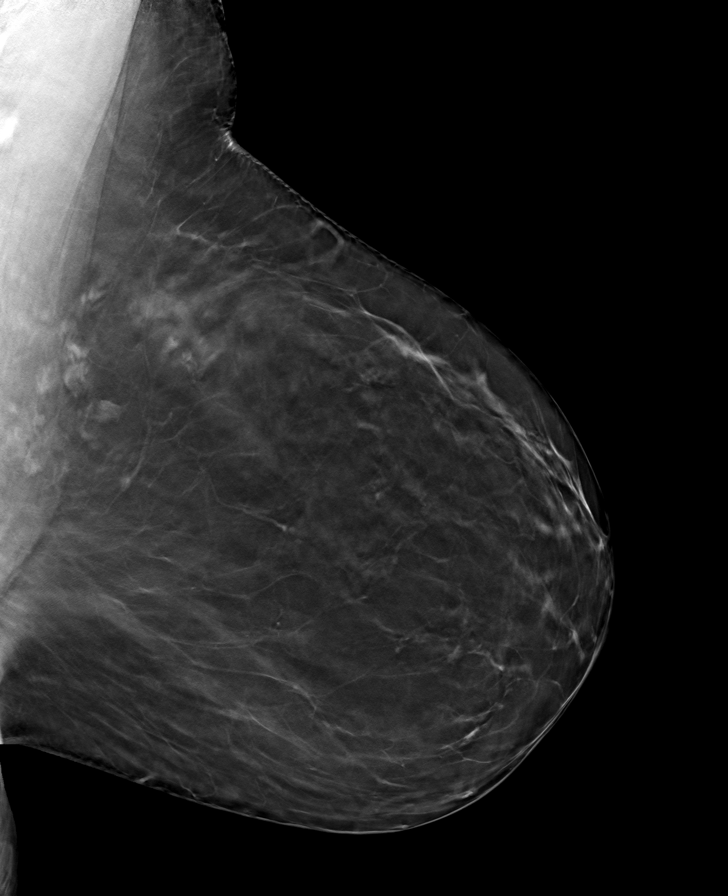

[8 of 24 positions shown; findings below may reference images not displayed]

ACR Breast Density Category b: There are scattered areas of
fibroglandular density.
FINDINGS: There are no findings suspicious for malignancy.
IMPRESSION: No mammographic evidence of malignancy. A result letter of this
screening mammogram will be mailed directly to the patient.

RECOMMENDATION:
Screening mammogram in one year. (Code:51-O-LD2)

BI-RADS CATEGORY  1: Negative.

## 2022-08-24 ENCOUNTER — Ambulatory Visit: Payer: No Typology Code available for payment source | Admitting: Nurse Practitioner

## 2022-08-24 ENCOUNTER — Encounter: Payer: Self-pay | Admitting: Nurse Practitioner

## 2022-08-24 VITALS — BP 138/82 | HR 87 | Temp 97.9°F | Ht 66.0 in | Wt 203.0 lb

## 2022-08-24 DIAGNOSIS — J069 Acute upper respiratory infection, unspecified: Secondary | ICD-10-CM | POA: Diagnosis not present

## 2022-08-24 DIAGNOSIS — E039 Hypothyroidism, unspecified: Secondary | ICD-10-CM | POA: Diagnosis not present

## 2022-08-24 DIAGNOSIS — E785 Hyperlipidemia, unspecified: Secondary | ICD-10-CM

## 2022-08-24 DIAGNOSIS — Z1152 Encounter for screening for COVID-19: Secondary | ICD-10-CM | POA: Diagnosis not present

## 2022-08-24 DIAGNOSIS — R6889 Other general symptoms and signs: Secondary | ICD-10-CM

## 2022-08-24 LAB — POCT RAPID STREP A (OFFICE): Rapid Strep A Screen: NEGATIVE

## 2022-08-24 LAB — POCT INFLUENZA A/B
Influenza A, POC: NEGATIVE
Influenza B, POC: NEGATIVE

## 2022-08-24 MED ORDER — AZITHROMYCIN 250 MG PO TABS: ORAL_TABLET | ORAL | 1 refills | Status: DC

## 2022-08-24 MED ORDER — DEXAMETHASONE 4 MG PO TABS: ORAL_TABLET | ORAL | 0 refills | Status: DC

## 2022-08-24 NOTE — Progress Notes (Signed)
Assessment and Plan:  Samantha Duran was seen today for sore throat.  Diagnoses and all orders for this visit:  Hypothyroidism, unspecified type Please take your thyroid medication greater than 30 min before breakfast, separated by at least 4 hours  from antacids, calcium, iron, and multivitamins.   Hyperlipidemia, unspecified hyperlipidemia type Continue diet and exercise   Acute URI Strep negative Push fluids Mucinex DM twice a day for 5 days Tylenol or Advil as needed for fever and aches Dexamethasone taper Zithromax as directed -     azithromycin (ZITHROMAX) 250 MG tablet; Take 2 tablets (500 mg) on  Day 1,  followed by 1 tablet (250 mg) once daily on Days 2 through 5. -     dexamethasone (DECADRON) 4 MG tablet; Take 3 tabs for 3 days, 2 tabs for 3 days 1 tab for 5 days. Take with food. -     POCT rapid strep A  Flu-like symptoms -     POCT Influenza A/B- negative             Further disposition pending results of labs. Discussed med's effects and SE's.   Over 30 minutes of exam, counseling, chart review, and critical decision making was performed.   Future Appointments  Date Time Provider Department Center  10/19/2022  9:00 AM [place], Archie Patten, NP [facility] None    ------------------------------------------------------------------------------------------------------------------   HPI BP 138/82   Pulse 87   Temp 97.9 F (36.6 C)   Ht  (1.676 m)   Wt 203 lb (92.1 kg)   SpO2 95%   BMI 32.77 kg/m   61 y.o.female presents for sore throat. She is noticing a sore throat with associated of fever that began Tuesday.  Has some non productive coughing  Denies body aches, congestion, nausea, vomiting and diarrhea. Denies ear pain. Has tried Nyquil and Catering manager plus.   Cholesterol is not at goal but is not currently on medications.  Is trying to control with diet and exercise Lab Results  Component Value Date   CHOL 247 (H) 10/18/2021   HDL 78 10/18/2021    LDLCALC 145 (H) 10/18/2021   TRIG 118 10/18/2021   CHOLHDL 3.2 10/18/2021    BP well controlled without medication.  Denies headaches, chest pain, shortness of breath and dizziness BP Readings from Last 3 Encounters:  08/24/22 138/82  07/25/22 112/70  10/18/21 122/84   Currently TSH is controlled with levothyroxine 125 mcg daily Lab Results  Component Value Date   TSH 2.00 10/18/2021    BMI is Body mass index is 32.77 kg/m.,  Wt Readings from Last 3 Encounters:  08/24/22 203 lb (92.1 kg)  07/25/22 203 lb 6.4 oz (92.3 kg)  10/18/21 203 lb 12.8 oz (92.4 kg)     Past Medical History:  Diagnosis Date   Anemia    iron   Asthma    Depression    GERD (gastroesophageal reflux disease)    History of adenomatous polyp of colon 05/29/2019   Hyperlipidemia    Hypertension    Migraine    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   aspirin 81 MG tablet Take 81 mg by mouth daily.   Aspirin Effervescent (ALKA-SELTZER PO)  Take by mouth.   Calcium Carbonate-Vitamin D (CALCIUM + D PO) Take by mouth.   Cholecalciferol (VITAMIN D) 125 MCG (5000 UT) CAPS Take 1,000 Units by mouth 2 (two) times daily. Takes 10,000 units daily   Coenzyme Q10 (CO Q 10 PO) Take 200 mg by mouth.   Cyanocobalamin (VITAMIN B-12 SL) Place under the tongue daily.   fluticasone-salmeterol (ADVAIR DISKUS) 100-50 MCG/ACT AEPB Use 1 Inhalation  2 x /day (every 12 hours) for Asthma                                                    /             USE                                BY                         MOUTH   IRON, FERROUS GLUCONATE, PO Iron (Ferrous Gluconate)   levothyroxine (SYNTHROID) 125 MCG tablet TAKE 1 TAB DAILY ON EMPTY  STOMACH WITH WATER ONLY  FOR 30 MIN. NO ANTACIDS,  CALCIUM, MAGNESIUM FOR 4  HOURS. AVOID BIOTIN   Magnesium 250 MG TABS Take by mouth daily.   Multiple Vitamin (MULTIVITAMIN) tablet Take 1 tablet by mouth daily.   Pseudoeph-Doxylamine-DM-APAP (NYQUIL PO) Take by mouth.   TURMERIC PO Take by mouth daily.   Zinc 50 MG TABS Take by mouth daily.   No current facility-administered medications on file prior to visit.    ROS: all negative except above.   Physical Exam:  BP 138/82   Pulse 87   Temp 97.9 F (36.6 C)   Ht  (1.676 m)   Wt 203 lb (92.1 kg)   SpO2 95%   BMI 32.77 kg/m   General Appearance: Well nourished, in no apparent distress. Eyes: PERRLA, EOMs, conjunctiva no swelling or erythema Sinuses: No Frontal/maxillary tenderness ENT/Mouth: Ext aud canals clear, TMs without erythema, bulging, fluid noted bilaterally. Some erythema but no swelling, or exudate on post pharynx. Marland Kitchen Hearing normal.  Neck: Supple, thyroid normal.  Respiratory: Respiratory effort normal, BS equal bilaterally without rales, rhonchi, wheezing or stridor.  Cardio: RRR with no MRGs. Brisk peripheral pulses without edema.  Abdomen: Soft, + BS.  Non tender, no guarding, rebound, hernias, masses. Lymphatics: Non tender without lymphadenopathy.  Musculoskeletal: Full ROM, 5/5 strength, normal gait.  Skin: Warm, dry without rashes, lesions, ecchymosis.  Neuro: Cranial nerves intact. Normal muscle tone, no cerebellar symptoms. Sensation intact.  Psych: Awake and oriented X 3, normal affect, Insight and Judgment appropriate.     Raynelle Dick, NP 5:01 PM [facility]

## 2022-08-24 NOTE — Patient Instructions (Addendum)
Push fluids Mucinex DM twice a day for 5 days Tylenol or Advil as needed for fever and aches Dexamethasone taper Zithromax as directed  Upper Respiratory Infection, Adult An upper respiratory infection (URI) is a common viral infection of the nose, throat, and upper air passages that lead to the lungs. The most common type of URI is the common cold. URIs usually get better on their own, without medical treatment. What are the causes? A URI is caused by a virus. You may catch a virus by: Breathing in droplets from an infected person's cough or sneeze. Touching something that has been exposed to the virus (is contaminated) and then touching your mouth, nose, or eyes. What increases the risk? You are more likely to get a URI if: You are very young or very old. You have close contact with others, such as at work, school, or a health care facility. You smoke. You have long-term (chronic) heart or lung disease. You have a weakened disease-fighting system (immune system). You have nasal allergies or asthma. You are experiencing a lot of stress. You have poor nutrition. What are the signs or symptoms? A URI usually involves some of the following symptoms: Runny or stuffy (congested) nose. Cough. Sneezing. Sore throat. Headache. Fatigue. Fever. Loss of appetite. Pain in your forehead, behind your eyes, and over your cheekbones (sinus pain). Muscle aches. Redness or irritation of the eyes. Pressure in the ears or face. How is this diagnosed? This condition may be diagnosed based on your medical history and symptoms, and a physical exam. Your health care provider may use a swab to take a mucus sample from your nose (nasal swab). This sample can be tested to determine what virus is causing the illness. How is this treated? URIs usually get better on their own within 7-10 days. Medicines cannot cure URIs, but your health care provider may recommend certain medicines to help relieve  symptoms, such as: Over-the-counter cold medicines. Cough suppressants. Coughing is a type of defense against infection that helps to clear the respiratory system, so take these medicines only as recommended by your health care provider. Fever-reducing medicines. Follow these instructions at home: Activity Rest as needed. If you have a fever, stay home from work or school until your fever is gone or until your health care provider says your URI cannot spread to other people (is no longer contagious). Your health care provider may have you wear a face mask to prevent your infection from spreading. Relieving symptoms Gargle with a mixture of salt and water 3-4 times a day or as needed. To make salt water, completely dissolve -1 tsp (3-6 g) of salt in 1 cup (237 mL) of warm water. Use a cool-mist humidifier to add moisture to the air. This can help you breathe more easily. Eating and drinking  Drink enough fluid to keep your urine pale yellow. Eat soups and other clear broths. General instructions  Take over-the-counter and prescription medicines only as told by your health care provider. These include cold medicines, fever reducers, and cough suppressants. Do not use any products that contain nicotine or tobacco. These products include cigarettes, chewing tobacco, and vaping devices, such as e-cigarettes. If you need help quitting, ask your health care provider. Stay away from secondhand smoke. Stay up to date on all immunizations, including the yearly (annual) flu vaccine. Keep all follow-up visits. This is important. How to prevent the spread of infection to others URIs can be contagious. To prevent the infection from spreading: Wash  your hands with soap and water for at least 20 seconds. If soap and water are not available, use hand sanitizer. Avoid touching your mouth, face, eyes, or nose. Cough or sneeze into a tissue or your sleeve or elbow instead of into your hand or into the  air.  Contact a health care provider if: You are getting worse instead of better. You have a fever or chills. Your mucus is brown or red. You have yellow or brown discharge coming from your nose. You have pain in your face, especially when you bend forward. You have swollen neck glands. You have pain while swallowing. You have white areas in the back of your throat. Get help right away if: You have shortness of breath that gets worse. You have severe or persistent: Headache. Ear pain. Sinus pain. Chest pain. You have chronic lung disease along with any of the following: Making high-pitched whistling sounds when you breathe, most often when you breathe out (wheezing). Prolonged cough (more than 14 days). Coughing up blood. A change in your usual mucus. You have a stiff neck. You have changes in your: Vision. Hearing. Thinking. Mood. These symptoms may be an emergency. Get help right away. Call 911. Do not wait to see if the symptoms will go away. Do not drive yourself to the hospital. Summary An upper respiratory infection (URI) is a common infection of the nose, throat, and upper air passages that lead to the lungs. A URI is caused by a virus. URIs usually get better on their own within 7-10 days. Medicines cannot cure URIs, but your health care provider may recommend certain medicines to help relieve symptoms. This information is not intended to replace advice given to you by your health care provider. Make sure you discuss any questions you have with your health care provider. Document Revised: 11/17/2020 Document Reviewed: 11/17/2020 Elsevier Patient Education  2023 [employer].

## 2022-08-28 ENCOUNTER — Telehealth: Payer: Self-pay

## 2022-08-28 NOTE — Telephone Encounter (Signed)
Patient called stating she was having a red face and a red neck. She denies SOB, hard to breath, or trouble swallowing. Spoke with Annabelle Harman and she said to stop the steroid. Patient aware to stop steroids and if she has any SOB or trouble breathing to go to the ER.

## 2022-10-19 ENCOUNTER — Encounter: Payer: Self-pay | Admitting: Nurse Practitioner

## 2022-10-19 ENCOUNTER — Ambulatory Visit (INDEPENDENT_AMBULATORY_CARE_PROVIDER_SITE_OTHER): Payer: No Typology Code available for payment source | Admitting: Nurse Practitioner

## 2022-10-19 VITALS — BP 130/78 | HR 80 | Temp 98.3°F | Ht 66.0 in | Wt 203.4 lb

## 2022-10-19 DIAGNOSIS — Z79899 Other long term (current) drug therapy: Secondary | ICD-10-CM

## 2022-10-19 DIAGNOSIS — E669 Obesity, unspecified: Secondary | ICD-10-CM

## 2022-10-19 DIAGNOSIS — Z1389 Encounter for screening for other disorder: Secondary | ICD-10-CM | POA: Diagnosis not present

## 2022-10-19 DIAGNOSIS — Z130 Encounter for screening for diseases of the blood and blood-forming organs and certain disorders involving the immune mechanism: Secondary | ICD-10-CM | POA: Diagnosis not present

## 2022-10-19 DIAGNOSIS — K2100 Gastro-esophageal reflux disease with esophagitis, without bleeding: Secondary | ICD-10-CM

## 2022-10-19 DIAGNOSIS — E538 Deficiency of other specified B group vitamins: Secondary | ICD-10-CM

## 2022-10-19 DIAGNOSIS — I10 Essential (primary) hypertension: Secondary | ICD-10-CM | POA: Diagnosis not present

## 2022-10-19 DIAGNOSIS — Z0000 Encounter for general adult medical examination without abnormal findings: Secondary | ICD-10-CM

## 2022-10-19 DIAGNOSIS — R7309 Other abnormal glucose: Secondary | ICD-10-CM

## 2022-10-19 DIAGNOSIS — R7303 Prediabetes: Secondary | ICD-10-CM

## 2022-10-19 DIAGNOSIS — E785 Hyperlipidemia, unspecified: Secondary | ICD-10-CM

## 2022-10-19 DIAGNOSIS — Z131 Encounter for screening for diabetes mellitus: Secondary | ICD-10-CM | POA: Diagnosis not present

## 2022-10-19 DIAGNOSIS — Z0001 Encounter for general adult medical examination with abnormal findings: Secondary | ICD-10-CM

## 2022-10-19 DIAGNOSIS — E039 Hypothyroidism, unspecified: Secondary | ICD-10-CM

## 2022-10-19 DIAGNOSIS — Z13220 Encounter for screening for lipoid disorders: Secondary | ICD-10-CM | POA: Diagnosis not present

## 2022-10-19 DIAGNOSIS — J4530 Mild persistent asthma, uncomplicated: Secondary | ICD-10-CM

## 2022-10-19 DIAGNOSIS — Z136 Encounter for screening for cardiovascular disorders: Secondary | ICD-10-CM | POA: Diagnosis not present

## 2022-10-19 DIAGNOSIS — K449 Diaphragmatic hernia without obstruction or gangrene: Secondary | ICD-10-CM | POA: Insufficient documentation

## 2022-10-19 DIAGNOSIS — F3342 Major depressive disorder, recurrent, in full remission: Secondary | ICD-10-CM

## 2022-10-19 DIAGNOSIS — E559 Vitamin D deficiency, unspecified: Secondary | ICD-10-CM

## 2022-10-19 DIAGNOSIS — E66811 Obesity, class 1: Secondary | ICD-10-CM

## 2022-10-19 LAB — HEMOGLOBIN A1C
Hgb A1c MFr Bld: 5.9 % of total Hgb — ABNORMAL HIGH (ref <5.7)
Mean Plasma Glucose: 123 mg/dL
eAG (mmol/L): 6.8 mmol/L

## 2022-10-19 LAB — COMPLETE METABOLIC PANEL WITH GFR
BUN: 15 mg/dL (ref 7–25)
CO2: 29 mmol/L (ref 20–32)
Calcium: 9.6 mg/dL (ref 8.6–10.4)
Globulin: 2.4 g/dL (calc) (ref 1.9–3.7)
Glucose, Bld: 100 mg/dL — ABNORMAL HIGH (ref 65–99)
Total Protein: 6.9 g/dL (ref 6.1–8.1)

## 2022-10-19 LAB — CBC WITH DIFFERENTIAL/PLATELET
Absolute Monocytes: 432 cells/uL (ref 200–950)
Eosinophils Absolute: 202 cells/uL (ref 15–500)
Hemoglobin: 13.7 g/dL (ref 11.7–15.5)
MCV: 92 fL (ref 80.0–100.0)
Platelets: 318 10*3/uL (ref 140–400)
RDW: 13.1 % (ref 11.0–15.0)
Total Lymphocyte: 21.7 %

## 2022-10-19 LAB — LIPID PANEL
Non-HDL Cholesterol (Calc): 184 mg/dL (calc) — ABNORMAL HIGH (ref <130)
Total CHOL/HDL Ratio: 3.5 (calc) (ref <5.0)

## 2022-10-19 NOTE — Patient Instructions (Signed)
Hernia, Adult     A hernia happens when an organ or tissue inside your body pushes out through a weak spot in the muscles of your belly (abdomen). This makes a bulge. The bulge may be: In a scar from a surgery that was done in your belly (incisional hernia). Near your belly button (umbilical hernia). In your groin (inguinal hernia). Your groin is the area where your leg meets your lower belly. If you are a female, this type could also be in your scrotum. In your upper thigh (femoral hernia). Inside your belly (hiatal hernia). This happens when your stomach slides above the muscle between your belly and your chest (diaphragm). What are the causes? This condition may be caused by: Lifting heavy things. Coughing over a long period of time. Having trouble pooping (constipation). Trouble pooping can lead to straining. A cut from surgery in your belly. A physical problem that is present at birth. Being very overweight. Smoking. Too much fluid in your belly. A testicle that has not moved down into the scrotum, in males. What are the signs or symptoms? The main symptom is a bulge in the area of the hernia, but a bulge may not always be seen. It may grow bigger or be easier to see when you cough or strain (such as when lifting something heavy). A hernia that can be pushed back into the belly rarely causes pain. A hernia that cannot be pushed back into the belly may lose its blood supply. This may cause: Pain. Fever. A feeling like you may vomit, and vomiting. Swelling. Trouble pooping. How is this treated? A hernia that is small and painless may not need to be treated. A hernia that is large or painful may be treated with surgery. Surgery to treat a hernia involves pushing the bulge back into place and repairing the weak area of the muscle or belly. Follow these instructions at home: Activity Avoid straining the muscles near your hernia. This can happen when you: Lift something heavy. Poop  (have a bowel movement). Do not lift anything that is heavier than 10 lb (4.5 kg), or the limit that you are told. When you lift something heavy, use your leg muscles. Do not use your back muscles to lift. Prevent trouble pooping If told by your doctor, take steps to prevent trouble pooping. You may need to: Drink enough fluid to keep your pee (urine) pale yellow. Take medicines. You will be told what medicines to take. Eat foods that are high in fiber. These include beans, whole grains, and fresh fruits and vegetables. Limit foods that are high in fat and sugar. These include fried or sweet foods. General instructions When you cough, try to cough gently. You may try to push your hernia back in by gently pressing on it when you are lying down. Do not try to force the bulge back in if it will not go in easily. If you are overweight, work with your doctor to lose weight safely. Do not smoke or use any products that contain nicotine or tobacco. If you need help quitting, ask your doctor. If you will be having surgery, watch your hernia for changes in shape, size, or color. Tell your doctor if you see any changes. Take over-the-counter and prescription medicines only as told by your doctor. Keep all follow-up visits. Contact a doctor if: You get new pain, swelling, or redness near your hernia. You poop fewer times in a week than normal. You have trouble pooping. You have   poop that is more dry than normal. You have poop that is harder or larger than normal. Get help right away if: You have a fever or chills. You have belly pain that gets worse. You feel like you may vomit, or you vomit. Your hernia cannot be pushed in by gently pressing on it when you are lying down. Your hernia: Changes in shape or size. Changes color. Feels hard, or it hurts when you touch it. These symptoms may be an emergency. Get help right away. Call your local emergency services (911 in the [place] Do not wait to  see if the symptoms will go away. Do not drive yourself to the hospital. Summary A hernia happens when an organ or tissue inside your body pushes out through a weak spot in the belly muscles. This creates a bulge. If your hernia is small and it does not hurt, you may not need treatment. If your hernia is large or it hurts, you may need surgery. If you will be having surgery, watch your hernia for changes in shape, size, or color. Tell your doctor about any changes. This information is not intended to replace advice given to you by your health care provider. Make sure you discuss any questions you have with your health care provider. Document Revised: 11/24/2019 Document Reviewed: 11/24/2019 Elsevier Patient Education  2024 [employer] Inc[employer]  

## 2022-10-19 NOTE — Progress Notes (Signed)
Complete Physical  Assessment and Plan:  Encounter for Annual Physical Exam with abnormal findings Due annually  Health Maintenance reviewed Healthy lifestyle reviewed and goals set  Essential hypertension Controlled Discussed DASH (Dietary Approaches to Stop Hypertension) DASH diet is lower in sodium than a typical American diet. Cut back on foods that are high in saturated fat, cholesterol, and trans fats. Eat more whole-grain foods, fish, poultry, and nuts Remain active and exercise as tolerated daily.  Monitor BP at home-Call if greater than 130/80.  Check CMP/CBC  Hypothyroidism, unspecified type Controlled. Continue Levothyroxine. Reminded to take on an empty stomach 30-26mins before food.  Stop any Biotin Supplement 48-72 hours before next TSH level to reduce the risk of falsely low TSH levels. Continue to monitor.     Hyperlipidemia, unspecified hyperlipidemia type Discussed lifestyle modifications. Recommended diet heavy in fruits and veggies, omega 3's. Decrease consumption of animal meats, cheeses, and dairy products. Remain active and exercise as tolerated. Continue to monitor. Check lipids/TSH  Uncomplicated asthma, unspecified asthma severity, unspecified whether persistent Controlled Continue Advair, albuterol PRN  Gastroesophageal reflux disease with esophagitis Continue Prevacid PRN. No suspected reflux complications (Barret/stricture). Lifestyle modification:  wt loss, avoid meals 2-3h before bedtime. Consider eliminating food triggers:  chocolate, caffeine, EtOH, acid/spicy food.  Recurrent major depressive disorder, in full remission (HCC) Remains in remission off of medications stress management techniques discussed, increase water, good sleep hygiene discussed, increase exercise, and increase veggies.   Vitamin D deficiency Continue supplement for goal of 60-100 Monitor Vitamin D levels  Medication management All medications discussed and  reviewed in full. All questions and concerns regarding medications addressed.    Obesity - BMI 32 Discussed Phentermine vs Wegovy Patient to reach out to insurance for weight loss coverage Discussed appropriate BMI Diet modification. Physical activity. Encouraged/praised to build confidence.  Abdominal hernia Refer to General Surgery for consultation Continue to monitor for increase in pain, discoloration, enlarging size, accompanied with N/V, constipation or unable to move bowels.   Orders Placed This Encounter  Procedures   CBC with Differential/Platelet   COMPLETE METABOLIC PANEL WITH GFR   Magnesium   Lipid panel   TSH   Hemoglobin A1c   Insulin, random   VITAMIN D 25 Hydroxy (Vit-D Deficiency, Fractures)   Urinalysis, Routine w reflex microscopic   Microalbumin / creatinine urine ratio   Vitamin B12   Ambulatory referral to General Surgery    Referral Priority:   Routine    Referral Type:   Surgical    Referral Reason:   Specialty Services Required    Referred to Provider:   Almond Lint, MD    Requested Specialty:   General Surgery    Number of Visits Requested:   1   EKG 12-Lead    Notify office for further evaluation and treatment, questions or concerns if any reported s/s fail to improve.   The patient was advised to call back or seek an in-person evaluation if any symptoms worsen or if the condition fails to improve as anticipated.   Further disposition pending results of labs. Discussed med's effects and SE's.    I discussed the assessment and treatment plan with the patient. The patient was provided an opportunity to ask questions and all were answered. The patient agreed with the plan and demonstrated an understanding of the instructions.  Discussed med's effects and SE's. Screening labs and tests as requested with regular follow-up as recommended.  I provided 40 minutes of face-to-face time during this encounter  including counseling, chart review, and  critical decision making was preformed.  Today's Plan of Care is based on a patient-centered health care approach known as shared decision making - the decisions, tests and treatments allow for patient preferences and values to be balanced with clinical evidence.    Future Appointments  Date Time Provider Department Center  10/19/2023  9:00 AM Adela Glimpse, NP [facility] None   HPI  62 y.o. female  presents for a complete physical. She has Hypertension; Hyperlipidemia; GERD (gastroesophageal reflux disease); Asthma; Depression; Vitamin D deficiency; Hypothyroidism; Obesity (BMI 30.0-34.9); History of adenomatous polyp of colon; Other abnormal glucose (prediabetes); and Recurrent major depressive disorder, in full remission (HCC) on their problem list.   She is married, 2 daughters, 3 grandsons, close by and sees frequently.   She works at [employer], retired from Field seismologist.   Overall she reports feeling well today.    She has a concern for an abdominal hernia that she feels as though has been present for many years.  She has noticed over the last few months the area is becoming more painful and noticeable.  Tender to touch, reports very sore.  Notices more when bending down or lifting.  Abdominal surgery includes hysterectomy.  Denies changes to BM, N/V.    Mild persistent asthma, advair BID and reports does very well with this. No recent flares.   GERD well controlled with lifestyle modification, rare tums.   She has hx of major depression, in remission off of medications. Reports sleeping well.   BMI is Body mass index is 32.83 kg/m., she has been working on diet, admits with current job very sedentary.  Wt Readings from Last 3 Encounters:  10/19/22 203 lb 6.4 oz (92.3 kg)  08/24/22 203 lb (92.1 kg)  07/25/22 203 lb 6.4 oz (92.3 kg)   Today their BP is BP: 130/78 She does workout. She denies chest pain, shortness of breath, dizziness.   She is not on cholesterol  medication and denies myalgias. Her cholesterol is not at goal, working on diet this year and prefers to avoid adding medication if possible. 10 year ASCVD from 10/18/2020 data was 2.9% 10 year risk, 39% lifetime risk. The cholesterol last visit was:   Lab Results  Component Value Date   CHOL 247 (H) 10/18/2021   HDL 78 10/18/2021   LDLCALC 145 (H) 10/18/2021   TRIG 118 10/18/2021   CHOLHDL 3.2 10/18/2021    Last A1C in the office was:  Lab Results  Component Value Date   HGBA1C 5.6 10/18/2021   Patient is on Vitamin D supplement, taking 16109 IU   Lab Results  Component Value Date   VD25OH 46 10/18/2021     She is on thyroid medication for history of thyroid cancer s/p thyroidectomy in 2012, was following with Dr. Talmage Nap but was released. Her medication was not changed last visit, 125 mcg daily, takes first thing with water, nothing else for 1 hour. She is on a biotin supplement.  Lab Results  Component Value Date   TSH 2.00 10/18/2021     Current Medications:  Current Outpatient Medications on File Prior to Visit  Medication Sig Dispense Refill   Ascorbic Acid (VITAMIN C) 500 MG CHEW Chew by mouth daily.     aspirin 81 MG tablet Take 81 mg by mouth daily.     Calcium Carbonate-Vitamin D (CALCIUM + D PO) Take by mouth.     Cholecalciferol (VITAMIN D) 125 MCG (5000 UT) CAPS  Take 1,000 Units by mouth 2 (two) times daily. Takes 10,000 units daily     Coenzyme Q10 (CO Q 10 PO) Take 200 mg by mouth.     Cyanocobalamin (VITAMIN B-12 SL) Place under the tongue daily.     fluticasone-salmeterol (ADVAIR DISKUS) 100-50 MCG/ACT AEPB Use 1 Inhalation  2 x /day (every 12 hours) for Asthma                                                    /             USE                                BY                         MOUTH 180 each 3   IRON, FERROUS GLUCONATE, PO Iron (Ferrous Gluconate)     levothyroxine (SYNTHROID) 125 MCG tablet TAKE 1 TAB DAILY ON EMPTY  STOMACH WITH WATER ONLY FOR 30 MIN. NO  ANTACIDS,  CALCIUM, MAGNESIUM FOR 4  HOURS. AVOID BIOTIN 90 tablet 3   Magnesium 250 MG TABS Take by mouth daily.     Multiple Vitamin (MULTIVITAMIN) tablet Take 1 tablet by mouth daily.     TURMERIC PO Take by mouth daily.     Zinc 50 MG TABS Take by mouth daily.     Aspirin Effervescent (ALKA-SELTZER PO) Take by mouth.     azithromycin (ZITHROMAX) 250 MG tablet Take 2 tablets (500 mg) on  Day 1,  followed by 1 tablet (250 mg) once daily on Days 2 through 5. 6 each 1   dexamethasone (DECADRON) 4 MG tablet Take 3 tabs for 3 days, 2 tabs for 3 days 1 tab for 5 days. Take with food. 20 tablet 0   Pseudoeph-Doxylamine-DM-APAP (NYQUIL PO) Take by mouth.     No current facility-administered medications on file prior to visit.   Allergies:  No Known Allergies   Medical History:  She has Hypertension; Hyperlipidemia; GERD (gastroesophageal reflux disease); Asthma; Depression; Vitamin D deficiency; Hypothyroidism; Obesity (BMI 30.0-34.9); History of adenomatous polyp of colon; Other abnormal glucose (prediabetes); and Recurrent major depressive disorder, in full remission (HCC) on their problem list.   Health Maintenance:   Immunization History  Administered Date(s) Administered   Influenza Inj Mdck Quad With Preservative 03/25/2017, 02/06/2018, 02/03/2019   Influenza,inj,quad, With Preservative 02/09/2016   Influenza-Unspecified 02/10/2012, 01/29/2014   PFIZER(Purple Top)SARS-COV-2 Vaccination 08/11/2019, 09/01/2019   PPD Test 03/23/2014   Pneumococcal-Unspecified 03/05/2008   Tdap 02/17/2007, 05/03/2016   Zoster Recombinat (Shingrix) 06/10/2021, 08/05/2021   Health Maintenance  Topic Date Due   COVID-19 Vaccine (3 - Pfizer risk series) 09/29/2019   MAMMOGRAM  11/02/2021   INFLUENZA VACCINE  11/30/2022   Colonoscopy  02/04/2026   DTaP/Tdap/Td (3 - Td or Tdap) 05/03/2026   PAP SMEAR-Modifier  10/19/2026   Hepatitis C Screening  Completed   HIV Screening  Completed   Zoster Vaccines-  Shingrix  Completed   HPV VACCINES  Aged Out   Shingrix: reports got, dates requested   No LMP recorded. (Menstrual status: Perimenopausal). Pap: 2023 Normal re-screen 5 years MGM: 11/02/2021 Due Colonoscopy:  01/2021 Recall 5 years  Last  Dental Exam:  Dr. Jon Billings, last 2023, goes q43m Last Eye Exam:  Dr. Harriette Bouillon, last 12/2021  Patient Care Team: Lucky Cowboy, MD as PCP - General (Internal Medicine) Glenford Peers, OD as Referring Physician (Optometry) Meryl Dare, MD as Consulting Physician (Gastroenterology)  Surgical History:  She has a past surgical history that includes Appendectomy; Total thyroidectomy (06/2010); Mandible surgery (Right); Colonoscopy (01/2014); and Polypectomy. Family History:  Herfamily history includes Diabetes in her paternal grandmother; Stomach cancer in her maternal aunt; Stroke in her paternal grandfather. Social History:  She reports that she quit smoking about 35 years ago. Her smoking use included cigarettes. She has a 5.00 pack-year smoking history. She has never used smokeless tobacco. She reports current alcohol use. She reports that she does not use drugs.  Review of Systems: Review of Systems  Constitutional: Negative.  Negative for malaise/fatigue and weight loss.  HENT: Negative.  Negative for hearing loss and tinnitus.   Eyes: Negative.  Negative for blurred vision and double vision.  Respiratory: Negative.  Negative for cough, shortness of breath and wheezing.   Cardiovascular: Negative.  Negative for chest pain, palpitations, orthopnea, claudication and leg swelling.  Gastrointestinal: Negative.  Negative for abdominal pain, blood in stool, constipation, diarrhea, heartburn, melena, nausea and vomiting.  Genitourinary: Negative.   Musculoskeletal: Negative.  Negative for joint pain and myalgias.  Skin: Negative.  Negative for rash.  Neurological: Negative.  Negative for dizziness, tingling, sensory change, weakness and  headaches.  Endo/Heme/Allergies: Negative.  Negative for polydipsia.  Psychiatric/Behavioral: Negative.    All other systems reviewed and are negative.   Physical Exam: Estimated body mass index is 32.83 kg/m as calculated from the following:   Height as of this encounter: 5\' 6"  (1.676 m).   Weight as of this encounter: 203 lb 6.4 oz (92.3 kg). BP 130/78   Pulse 80   Temp 98.3 F (36.8 C)   Ht 5\' 6"  (1.676 m)   Wt 203 lb 6.4 oz (92.3 kg)   SpO2 98%   BMI 32.83 kg/m  General Appearance: Well nourished, in no apparent distress.  Eyes: PERRLA, EOMs, conjunctiva no swelling or erythema Sinuses: No Frontal/maxillary tenderness  ENT/Mouth: Ext aud canals clear, normal light reflex with TMs without erythema, bulging. Good dentition. No erythema, swelling, or exudate on post pharynx. Tonsils not swollen or erythematous. Hearing normal.  Neck: Supple, well healed surgical scar at base of neck, no lumps or nodules. No bruits  Respiratory: Respiratory effort normal, BS equal bilaterally without rales, rhonchi, wheezing or stridor.  Cardio: RRR without murmurs, without rubs or gallops. Brisk peripheral pulses without edema.  Chest: symmetric, with normal excursions and percussion.  Breasts: no concerns on self exams, defer manual, getting mammogram  Abdomen: RLQ medial and distal to umbilicus with palpable approximately 3 cm round firm moveable mass. Tender to palpation.  No guarding, rebound, hernias, masses, or organomegaly.  Lymphatics: Non tender without lymphadenopathy.  Musculoskeletal: Full ROM all peripheral extremities,5/5 strength, and normal gait.  Skin: Warm, dry without rashes, lesions, ecchymosis. Several small benign appearing nevi. Skin tag to chest, large, non-inflamed. Neuro: Cranial nerves intact, reflexes equal bilaterally. Normal muscle tone, no cerebellar symptoms. Sensation intact.  Psych: Awake and oriented X 3, normal affect, Insight and Judgment appropriate.    EKG:  NSR, NSCPT  Jaxtyn Linville, NP-C 9:03 AM [facility]

## 2022-10-20 LAB — CBC WITH DIFFERENTIAL/PLATELET
Basophils Absolute: 72 cells/uL (ref 0–200)
Basophils Relative: 1 %
Eosinophils Relative: 2.8 %
HCT: 41.3 % (ref 35.0–45.0)
Lymphs Abs: 1562 cells/uL (ref 850–3900)
MCH: 30.5 pg (ref 27.0–33.0)
MCHC: 33.2 g/dL (ref 32.0–36.0)
MPV: 10.1 fL (ref 7.5–12.5)
Monocytes Relative: 6 %
Neutro Abs: 4932 cells/uL (ref 1500–7800)
Neutrophils Relative %: 68.5 %
RBC: 4.49 10*6/uL (ref 3.80–5.10)
WBC: 7.2 10*3/uL (ref 3.8–10.8)

## 2022-10-20 LAB — TSH: TSH: 2.66 mIU/L (ref 0.40–4.50)

## 2022-10-20 LAB — VITAMIN B12: Vitamin B-12: 907 pg/mL (ref 200–1100)

## 2022-10-20 LAB — URINALYSIS, ROUTINE W REFLEX MICROSCOPIC
Bilirubin Urine: NEGATIVE
Glucose, UA: NEGATIVE
Hgb urine dipstick: NEGATIVE
Ketones, ur: NEGATIVE
Leukocytes,Ua: NEGATIVE
Nitrite: NEGATIVE
Protein, ur: NEGATIVE
Specific Gravity, Urine: 1.011 (ref 1.001–1.035)
pH: 6.5 (ref 5.0–8.0)

## 2022-10-20 LAB — MICROALBUMIN / CREATININE URINE RATIO
Creatinine, Urine: 59 mg/dL (ref 20–275)
Microalb Creat Ratio: 3 mg/g creat (ref <30)
Microalb, Ur: 0.2 mg/dL

## 2022-10-20 LAB — COMPLETE METABOLIC PANEL WITH GFR
AG Ratio: 1.9 (calc) (ref 1.0–2.5)
ALT: 20 U/L (ref 6–29)
AST: 12 U/L (ref 10–35)
Albumin: 4.5 g/dL (ref 3.6–5.1)
Alkaline phosphatase (APISO): 69 U/L (ref 37–153)
Chloride: 104 mmol/L (ref 98–110)
Creat: 0.71 mg/dL (ref 0.50–1.05)
Potassium: 4.5 mmol/L (ref 3.5–5.3)
Sodium: 141 mmol/L (ref 135–146)
Total Bilirubin: 0.5 mg/dL (ref 0.2–1.2)
eGFR: 97 mL/min/{1.73_m2} (ref >=60)

## 2022-10-20 LAB — LIPID PANEL
Cholesterol: 258 mg/dL — ABNORMAL HIGH (ref <200)
HDL: 74 mg/dL (ref >=50)
LDL Cholesterol (Calc): 156 mg/dL (calc) — ABNORMAL HIGH
Triglycerides: 153 mg/dL — ABNORMAL HIGH (ref <150)

## 2022-10-20 LAB — VITAMIN D 25 HYDROXY (VIT D DEFICIENCY, FRACTURES): Vit D, 25-Hydroxy: 45 ng/mL (ref 30–100)

## 2022-10-20 LAB — MAGNESIUM: Magnesium: 2.4 mg/dL (ref 1.5–2.5)

## 2022-10-20 LAB — INSULIN, RANDOM: Insulin: 9.3 u[IU]/mL

## 2022-10-23 ENCOUNTER — Other Ambulatory Visit: Payer: Self-pay | Admitting: Internal Medicine

## 2022-10-23 DIAGNOSIS — Z1231 Encounter for screening mammogram for malignant neoplasm of breast: Secondary | ICD-10-CM

## 2022-10-25 ENCOUNTER — Ambulatory Visit
Admission: RE | Admit: 2022-10-25 | Discharge: 2022-10-25 | Disposition: A | Discharge status: Home / Self Care | Payer: No Typology Code available for payment source | Source: Ambulatory Visit | Attending: Internal Medicine | Admitting: Internal Medicine

## 2022-10-25 DIAGNOSIS — Z1231 Encounter for screening mammogram for malignant neoplasm of breast: Secondary | ICD-10-CM

## 2022-11-22 ENCOUNTER — Other Ambulatory Visit (HOSPITAL_COMMUNITY): Payer: Self-pay | Admitting: Surgery

## 2022-11-22 DIAGNOSIS — K439 Ventral hernia without obstruction or gangrene: Secondary | ICD-10-CM

## 2022-11-28 ENCOUNTER — Ambulatory Visit (HOSPITAL_COMMUNITY)
Admission: RE | Admit: 2022-11-28 | Discharge: 2022-11-28 | Disposition: A | Discharge status: Home / Self Care | Payer: No Typology Code available for payment source | Source: Ambulatory Visit | Attending: Surgery | Admitting: Surgery

## 2022-11-28 DIAGNOSIS — K439 Ventral hernia without obstruction or gangrene: Secondary | ICD-10-CM | POA: Insufficient documentation

## 2022-12-13 ENCOUNTER — Other Ambulatory Visit (HOSPITAL_COMMUNITY): Payer: Self-pay | Admitting: Surgery

## 2022-12-13 DIAGNOSIS — R011 Cardiac murmur, unspecified: Secondary | ICD-10-CM

## 2022-12-27 ENCOUNTER — Ambulatory Visit (HOSPITAL_COMMUNITY)
Admission: RE | Admit: 2022-12-27 | Discharge: 2022-12-27 | Disposition: A | Discharge status: Home / Self Care | Payer: No Typology Code available for payment source | Source: Ambulatory Visit | Attending: Surgery | Admitting: Surgery

## 2022-12-27 DIAGNOSIS — E785 Hyperlipidemia, unspecified: Secondary | ICD-10-CM | POA: Insufficient documentation

## 2022-12-27 DIAGNOSIS — R011 Cardiac murmur, unspecified: Secondary | ICD-10-CM | POA: Diagnosis not present

## 2022-12-27 DIAGNOSIS — I10 Essential (primary) hypertension: Secondary | ICD-10-CM | POA: Insufficient documentation

## 2022-12-28 LAB — ECHOCARDIOGRAM COMPLETE
Area-P 1/2: 3.53 cm2
Calc EF: 58.1 %
S' Lateral: 2.8 cm
Single Plane A2C EF: 57.1 %
Single Plane A4C EF: 60.7 %

## 2023-01-08 ENCOUNTER — Ambulatory Visit: Payer: No Typology Code available for payment source | Admitting: Nurse Practitioner

## 2023-01-11 ENCOUNTER — Other Ambulatory Visit: Payer: Self-pay | Admitting: Internal Medicine

## 2023-04-16 ENCOUNTER — Encounter: Payer: Self-pay | Admitting: Internal Medicine

## 2023-04-16 NOTE — Progress Notes (Unsigned)
  [facility]      ADULT   &   ADOLESCENT      INTERNAL MEDICINE  Lucky Cowboy, M.D.          Rance Muir, ANP        Adela Glimpse, FNP  [facility] [address]  [place], [place]. [address] Telephone [phone] Telefax [phone]   Future Appointments  Date Time Provider Department  04/17/2023  3:30 PM Lucky Cowboy, MD [facility]  10/19/2023  9:00 AM Adela Glimpse, NP [facility]    History of Present Illness:      He patient is a very nice 62 yo MWF with  hx/o labile,HTN, HLD, Hypothyroidism, PreDiabetes presents with c/o "spinning type vertigo" imbalance with positional changes  .  Denies  associated N/V or focal neuro signs or Sx's.   Current Outpatient Medications on File Prior to Visit  Medication Sig   VITAMIN C 500 MG   daily   aspirin 81 MG tablet Take daily.   Calcium -Vitamin D  Take daily   VITAMIN D   Takes 10,000 units daily   Coenzyme Q10  Take 200 mg    VITAMIN B-12 SL  daily.   ADVAIR 100-50  USE 1 INHALATION  TWICE  DAILY    FERROUS GLUCONATE Iron (Ferrous Gluconate)   Levothyroxine  125 MCG tablet TAKE 1 TAB DAILY    Magnesium 250 MG TABS Take  daily.   Multiple Vitamin  Take 1 tablet  daily.   TURMERIC  Take  daily.   Zinc 50 MG TABS Take  daily.    Not on File   Problem list She has Hypertension; Hyperlipidemia; GERD (gastroesophageal reflux disease); Asthma; Depression; Vitamin D deficiency; Hypothyroidism; Obesity (BMI 30.0-34.9); History of adenomatous polyp of colon; Other abnormal glucose (prediabetes); Recurrent major depressive disorder, in full remission (HCC); and Diaphragmatic hernia without obstruction and without gangrene on their problem list.   Observations/Objective:  BP 120/70   Pulse 77   Temp 97.9 F (36.6 C)   Resp 16   Ht 5\' 6"  (1.676 m)   Wt 206 lb 3.2 oz (93.5 kg)   SpO2 99%   BMI 33.28 kg/m   HEENT - WNL. Cr N 2-12 - Nl Symmetric. PERRLA  Neck - supple.  Chest - Clear  equal BS. Cor - Nl HS. RRR w/o sig MGR. PP 1(+). No edema. MS- FROM w/o deformities.  Gait Nl. Neuro -  Nl w/o focal abnormalities.   Assessment and Plan:   1. Vertigo (Primary)  - dexamethasone   4 MG tablet;  Take 1 tab 3 x day - 3 days, then 2 x day - 3 days, then 1 tab daily   Dispense: 20 tablet; Refill: 0  - meclizine   25 MG tablet;  Take 1/2 to 1 tablet 2 to 3 x /day as needed for Dizziness / Vertigo   Dispense: 90 tablet; Refill: 1  - Discussed med effects & side effects   Follow Up Instructions:        I discussed the assessment and treatment plan with the patient. The patient was provided an opportunity to ask questions and all were answered. The patient agreed with the plan and demonstrated an understanding of the instructions.       The patient was advised to call back or seek an in-person evaluation if the symptoms worsen or if the condition fails to improve as anticipated.    Samantha Maw, MD

## 2023-04-17 ENCOUNTER — Ambulatory Visit: Payer: No Typology Code available for payment source | Admitting: Internal Medicine

## 2023-04-17 VITALS — BP 120/70 | HR 77 | Temp 97.9°F | Resp 16 | Ht 66.0 in | Wt 206.2 lb

## 2023-04-17 DIAGNOSIS — R42 Dizziness and giddiness: Secondary | ICD-10-CM | POA: Diagnosis not present

## 2023-04-17 MED ORDER — MECLIZINE HCL 25 MG PO TABS: ORAL_TABLET | ORAL | 1 refills | Status: AC

## 2023-04-17 MED ORDER — DEXAMETHASONE 4 MG PO TABS: ORAL_TABLET | ORAL | 0 refills | Status: AC

## 2023-04-17 NOTE — Patient Instructions (Signed)
Vertig   Vertigo is the feeling that you or the things around you  are moving or spinning when they're not. It's different than feeling dizzy. It can also cause: Loss of balance. Trouble standing or walking. Nausea and vomiting. This feeling can come and go at any time. It can last from a few seconds to minutes or even hours. It may go away on its own or be treated with medicine. What are the types of vertigo? There are two types of vertigo: Peripheral vertigo happens when parts of your inner ear don't work like they should. This is the more common type. Central vertigo happens when your brain and spinal cord don't work like they should. Your health care provider will do tests to find out what kind of vertigo you have. This will help them decide on the right treatment for you. Follow these instructions at home: Eating and drinking Drink enough fluid to keep your pee (urine) pale yellow. Do not drink alcohol. Activity When you get up in the morning, first sit up on the side of the bed. When you feel okay, stand slowly while holding onto something. Move slowly. Avoid sudden body or head movements. Avoid certain positions, as told by your provider. Use a cane if you have trouble standing or walking. Sit down right away if you feel unsteady. Place items in your home so they're easy for you to reach without bending or leaning over. Return to normal activities when you're told. Ask what things are safe for you to do. General instructions Take your medicines only as told by your provider. Contact a health care provider if: Your medicines don't help or make your vertigo worse. You get new symptoms. You have a fever. You have nausea or vomiting. Your family or friends spot any changes in how you're acting. A part of your body goes numb. You feel tingling and prickling in a part of your body. You get very bad headaches. Get help right away if: You're always dizzy or you faint. You have a  stiff neck. You have trouble moving or speaking. Your hands, arms, or legs feel weak. Your hearing or eyesight changes.   How to Perform the Epley Maneuver  The Epley maneuver is an exercise that relieves symptoms of vertigo. Vertigo is the feeling that you or your surroundings are moving when they are not. When you feel vertigo, you may feel like the room is spinning and may have trouble walking. The Epley maneuver is used for a type of vertigo caused by a calcium deposit in a part of the inner ear. The maneuver involves changing head positions to help the deposit move out of the area. You can do this maneuver at home whenever you have symptoms of vertigo. You can repeat it in 24 hours if your vertigo has not gone away. Even though the Epley maneuver may relieve your vertigo for a few weeks, it is possible that your symptoms will return. This maneuver relieves vertigo, but it does not relieve dizziness. What are the risks? If it is done correctly, the Epley maneuver is considered safe. Sometimes it can lead to dizziness or nausea that goes away after a short time. If you develop other symptoms--such as changes in vision, weakness, or numbness--stop doing the maneuver and call your health care provider. Supplies needed: A bed or table. A pillow. How to do the Epley maneuver     Sit on the edge of a bed or table with your back straight  and your legs extended or hanging over the edge of the bed or table. Turn your head halfway toward the affected ear or side as told by your health care provider. Lie backward quickly with your head turned until you are lying flat on your back. Your head should dangle (head-hanging position). You may want to position a pillow under your shoulders. Hold this position for at least 30 seconds. If you feel dizzy or have symptoms of vertigo, continue to hold the position until the symptoms stop. Turn your head to the opposite direction until your unaffected ear is  facing down. Your head should continue to dangle. Hold this position for at least 30 seconds. If you feel dizzy or have symptoms of vertigo, continue to hold the position until the symptoms stop. Turn your whole body to the same side as your head so that you are positioned on your side. Your head will now be nearly facedown and no longer needs to dangle. Hold for at least 30 seconds. If you feel dizzy or have symptoms of vertigo, continue to hold the position until the symptoms stop. Sit back up. You can repeat the maneuver in 24 hours if your vertigo does not go away. Follow these instructions at home: For 24 hours after doing the Epley maneuver: Keep your head in an upright position. When lying down to sleep or rest, keep your head raised (elevated) with two or more pillows. Avoid excessive neck movements. Activity Do not drive or use machinery if you feel dizzy. After doing the Epley maneuver, return to your normal activities as told by your health care provider. Ask your health care provider what activities are safe for you. General instructions Drink enough fluid to keep your urine pale yellow. Do not drink alcohol. Take over-the-counter and prescription medicines only as told by your health care provider. Keep all follow-up visits. This is important. Preventing vertigo symptoms Ask your health care provider if there is anything you should do at home to prevent vertigo. He or she may recommend that you: Keep your head elevated with two or more pillows while you sleep. Do not sleep on the side of your affected ear. Get up slowly from bed. Avoid sudden movements during the day. Avoid extreme head positions or movement, such as looking up or bending over. Contact a health care provider if: Your vertigo gets worse. You have other symptoms, including: Nausea. Vomiting. Headache. Get help right away if you: Have vision changes. Have a headache or neck pain that is severe or getting  worse. Cannot stop vomiting. Have new numbness or weakness in any part of your body. These symptoms may represent a serious problem that is an emergency. Do not wait to see if the symptoms will go away. Get medical help right away. Call your local emergency services (911 in the [place] Do not drive yourself to the hospital. Summary Vertigo is the feeling that you or your surroundings are moving when they are not. The Epley maneuver is an exercise that relieves symptoms of vertigo. If the Epley maneuver is done correctly, it is considered safe. This information is not intended to replace advice given to you by your health care provider. Make sure you discuss any questions you have with your health care provider. Document Revised: 03/17/2020 Document Reviewed: 03/17/2020 Elsevier Patient Education  2024 [employer].

## 2023-04-18 ENCOUNTER — Encounter: Payer: Self-pay | Admitting: Internal Medicine

## 2023-06-20 ENCOUNTER — Encounter: Payer: Self-pay | Admitting: Internal Medicine

## 2023-06-21 MED ORDER — FLUTICASONE-SALMETEROL 100-50 MCG/ACT IN AEPB: INHALATION_SPRAY | RESPIRATORY_TRACT | 1 refills | Status: AC

## 2023-06-21 MED ORDER — LEVOTHYROXINE SODIUM 125 MCG PO TABS: ORAL_TABLET | ORAL | 0 refills | Status: AC

## 2023-10-19 ENCOUNTER — Encounter: Payer: No Typology Code available for payment source | Admitting: Nurse Practitioner

## 2023-11-26 ENCOUNTER — Encounter: Payer: Self-pay | Admitting: Family Medicine

## 2023-11-26 ENCOUNTER — Other Ambulatory Visit: Payer: Self-pay | Admitting: Family Medicine

## 2023-11-26 DIAGNOSIS — Z1231 Encounter for screening mammogram for malignant neoplasm of breast: Secondary | ICD-10-CM

## 2023-12-28 ENCOUNTER — Ambulatory Visit
Admission: RE | Admit: 2023-12-28 | Discharge: 2023-12-28 | Disposition: A | Discharge status: Home / Self Care | Source: Ambulatory Visit | Attending: Family Medicine | Admitting: Family Medicine

## 2023-12-28 ENCOUNTER — Ambulatory Visit

## 2023-12-28 DIAGNOSIS — Z1231 Encounter for screening mammogram for malignant neoplasm of breast: Secondary | ICD-10-CM

## 2024-01-03 ENCOUNTER — Other Ambulatory Visit: Payer: Self-pay | Admitting: Family Medicine

## 2024-01-03 DIAGNOSIS — R928 Other abnormal and inconclusive findings on diagnostic imaging of breast: Secondary | ICD-10-CM

## 2024-01-11 ENCOUNTER — Encounter

## 2024-01-11 ENCOUNTER — Other Ambulatory Visit

## 2024-02-01 ENCOUNTER — Ambulatory Visit
Admission: RE | Admit: 2024-02-01 | Discharge: 2024-02-01 | Disposition: A | Discharge status: Home / Self Care | Source: Ambulatory Visit | Attending: Family Medicine | Admitting: Family Medicine

## 2024-02-01 DIAGNOSIS — R928 Other abnormal and inconclusive findings on diagnostic imaging of breast: Secondary | ICD-10-CM
